# Patient Record
Sex: Male | Born: 1965 | Race: Black or African American | Hispanic: No | Marital: Single | Smoking: Former smoker
Health system: Southern US, Community
[De-identification: ages and names within clinical notes are randomized; demographics above are authoritative.]

## PROBLEM LIST (undated history)

## (undated) DIAGNOSIS — R946 Abnormal results of thyroid function studies: Secondary | ICD-10-CM

## (undated) DIAGNOSIS — R0789 Other chest pain: Secondary | ICD-10-CM

## (undated) DIAGNOSIS — E7800 Pure hypercholesterolemia, unspecified: Secondary | ICD-10-CM

## (undated) DIAGNOSIS — I10 Essential (primary) hypertension: Secondary | ICD-10-CM

## (undated) HISTORY — PX: KNEE SURGERY: SHX244

## (undated) HISTORY — PX: HERNIA REPAIR: SHX51

---

## 2003-12-27 ENCOUNTER — Other Ambulatory Visit: Payer: Self-pay

## 2004-09-08 ENCOUNTER — Emergency Department: Payer: Self-pay | Admitting: Emergency Medicine

## 2004-12-09 ENCOUNTER — Emergency Department: Payer: Self-pay | Admitting: Emergency Medicine

## 2005-01-13 ENCOUNTER — Other Ambulatory Visit: Payer: Self-pay

## 2005-01-13 ENCOUNTER — Emergency Department: Payer: Self-pay | Admitting: Emergency Medicine

## 2007-09-11 ENCOUNTER — Other Ambulatory Visit: Payer: Self-pay

## 2007-09-11 ENCOUNTER — Emergency Department: Payer: Self-pay | Admitting: Emergency Medicine

## 2007-11-03 ENCOUNTER — Emergency Department: Payer: Self-pay | Admitting: Emergency Medicine

## 2008-01-05 ENCOUNTER — Emergency Department: Payer: Self-pay | Admitting: Emergency Medicine

## 2008-01-05 ENCOUNTER — Other Ambulatory Visit: Payer: Self-pay

## 2008-09-19 ENCOUNTER — Emergency Department: Payer: Self-pay | Admitting: Emergency Medicine

## 2013-03-30 ENCOUNTER — Encounter (HOSPITAL_COMMUNITY): Payer: Self-pay | Admitting: Emergency Medicine

## 2013-03-30 ENCOUNTER — Emergency Department (HOSPITAL_COMMUNITY)
Admission: EM | Admit: 2013-03-30 | Discharge: 2013-03-31 | Attending: Emergency Medicine | Admitting: Emergency Medicine

## 2013-03-30 DIAGNOSIS — Z79899 Other long term (current) drug therapy: Secondary | ICD-10-CM | POA: Insufficient documentation

## 2013-03-30 DIAGNOSIS — R109 Unspecified abdominal pain: Secondary | ICD-10-CM | POA: Insufficient documentation

## 2013-03-30 DIAGNOSIS — Z7982 Long term (current) use of aspirin: Secondary | ICD-10-CM | POA: Insufficient documentation

## 2013-03-30 DIAGNOSIS — N5082 Scrotal pain: Secondary | ICD-10-CM

## 2013-03-30 DIAGNOSIS — N289 Disorder of kidney and ureter, unspecified: Secondary | ICD-10-CM

## 2013-03-30 DIAGNOSIS — Z9889 Other specified postprocedural states: Secondary | ICD-10-CM | POA: Insufficient documentation

## 2013-03-30 DIAGNOSIS — E7800 Pure hypercholesterolemia, unspecified: Secondary | ICD-10-CM | POA: Insufficient documentation

## 2013-03-30 DIAGNOSIS — F17200 Nicotine dependence, unspecified, uncomplicated: Secondary | ICD-10-CM | POA: Insufficient documentation

## 2013-03-30 DIAGNOSIS — I10 Essential (primary) hypertension: Secondary | ICD-10-CM | POA: Insufficient documentation

## 2013-03-30 HISTORY — DX: Pure hypercholesterolemia, unspecified: E78.00

## 2013-03-30 HISTORY — DX: Essential (primary) hypertension: I10

## 2013-03-30 LAB — CBC WITH DIFFERENTIAL/PLATELET
Eosinophils Relative: 1 % (ref 0–5)
HCT: 41.8 % (ref 39.0–52.0)
Hemoglobin: 13.8 g/dL (ref 13.0–17.0)
Lymphocytes Relative: 36 % (ref 12–46)
Lymphs Abs: 2.5 10*3/uL (ref 0.7–4.0)
MCV: 82.6 fL (ref 78.0–100.0)
Monocytes Absolute: 0.6 10*3/uL (ref 0.1–1.0)
RBC: 5.06 MIL/uL (ref 4.22–5.81)
WBC: 6.9 10*3/uL (ref 4.0–10.5)

## 2013-03-30 LAB — COMPREHENSIVE METABOLIC PANEL
ALT: 9 U/L (ref 0–53)
CO2: 29 mEq/L (ref 19–32)
Calcium: 9.5 mg/dL (ref 8.4–10.5)
Chloride: 101 mEq/L (ref 96–112)
Creatinine, Ser: 1.55 mg/dL — ABNORMAL HIGH (ref 0.50–1.35)
GFR calc Af Amer: 59 mL/min — ABNORMAL LOW (ref >90)
GFR calc non Af Amer: 51 mL/min — ABNORMAL LOW (ref >90)
Glucose, Bld: 104 mg/dL — ABNORMAL HIGH (ref 70–99)
Total Bilirubin: 0.5 mg/dL (ref 0.3–1.2)

## 2013-03-30 MED ORDER — KETOROLAC TROMETHAMINE 30 MG/ML IJ SOLN
30.0000 mg | Freq: Once | INTRAMUSCULAR | Status: AC
  Administered 2013-03-30: 30 mg via INTRAVENOUS
  Filled 2013-03-30: qty 1

## 2013-03-30 NOTE — ED Notes (Signed)
Patient complaining of right testicular pain. Reports started in right lower quadrant of abdomen and radiated into right testicle. Per nursing staff [facility] prison, patient's right testicle is swollen and somewhat retracted when compared to left testicle. Patient denies abdominal pain at this time, reports pain to right testicle only.

## 2013-03-30 NOTE — ED Provider Notes (Signed)
CSN: [id number]     Arrival date & time 03/30/13  2240 History  This chart was scribed for Dione Booze, MD by Greggory Stallion, ED Scribe. This patient was seen in room [facility]10/[facility]10 and the patient's care was started at 10:56 PM.   Chief Complaint  Patient presents with  . Testicle Pain   The history is provided by the patient. No language interpreter was used.    HPI Comments: Todd Robles is a 47 y.o. male who presents to the Emergency Department complaining of gradual onset, constant sharp right testicle pain that started around 8 PM tonight. He states the pain started in his RLQ and radiated to his testicle. He states the pain is worsened by certain movements. The nursing staff at the prison states his right testicle is swollen and somewhat retracted when compared to the left testicle. He has not taken anything for the pain. Pt denies abdominal pain, nausea, sweats and difficulty urinating as associated symptoms.   Past Medical History  Diagnosis Date  . Hypertension   . Hypercholesteremia    Past Surgical History  Procedure Laterality Date  . Hernia repair    . Knee surgery      left   History reviewed. No pertinent family history. History  Substance Use Topics  . Smoking status: Current Every Day Smoker  . Smokeless tobacco: Not on file  . Alcohol Use: Not on file    Review of Systems  Gastrointestinal: Negative for nausea and abdominal pain.  Genitourinary: Positive for testicular pain. Negative for difficulty urinating.  All other systems reviewed and are negative.    Allergies  Review of patient's allergies indicates no known allergies.  Home Medications   Current Outpatient Rx  Name  Route  Sig  Dispense  Refill  . aspirin 81 MG tablet   Oral   Take 81 mg by mouth daily.         . hydrochlorothiazide (HYDRODIURIL) 25 MG tablet   Oral   Take 25 mg by mouth daily.         . metoprolol (LOPRESSOR) 100 MG tablet   Oral   Take 100 mg by mouth 2  (two) times daily.         . simvastatin (ZOCOR) 40 MG tablet   Oral   Take 40 mg by mouth every evening.         . tamsulosin (FLOMAX) 0.4 MG CAPS capsule   Oral   Take 0.4 mg by mouth.         . tamsulosin (FLOMAX) 0.4 MG CAPS capsule   Oral   Take by mouth.          BP 167/103  Pulse 63  Temp(Src) 97.9 F (36.6 C) (Oral)  Resp 20  Ht 5\' 7"  (1.702 m)  Wt 205 lb (92.987 kg)  BMI 32.1 kg/m2  SpO2 100%  Physical Exam  Nursing note and vitals reviewed. Constitutional: He is oriented to person, place, and time. He appears well-developed and well-nourished. No distress.  HENT:  Head: Normocephalic and atraumatic.  Right Ear: External ear normal.  Left Ear: External ear normal.  Nose: Nose normal.  Mouth/Throat: Oropharynx is clear and moist.  Eyes: EOM are normal.  Neck: Normal range of motion. Neck supple. No tracheal deviation present.  Cardiovascular: Normal rate, regular rhythm and normal heart sounds.   No murmur heard. Pulmonary/Chest: Effort normal and breath sounds normal. No respiratory distress. He has no wheezes. He has no rales.  Abdominal: Soft. There is tenderness.  Mild tenderness RUQ and mid abdomen. Bowel sounds are decreased.   Genitourinary:  Circumsized penis. Right testicle somewhat high riding and mildly tender. No swelling or induration. Moderate tenderness of inguinal canal. No hernia palpable. No ungual adenopathy.   Musculoskeletal: Normal range of motion.  Mild right CVA tenderness.   Neurological: He is alert and oriented to person, place, and time.  Skin: Skin is warm and dry. He is not diaphoretic.  Psychiatric: He has a normal mood and affect. His behavior is normal.    ED Course   Procedures (including critical care time)  DIAGNOSTIC STUDIES: Oxygen Saturation is 100% on RA, normal by my interpretation.    COORDINATION OF CARE: 11:05 PM-Discussed treatment plan which includes pain medication, ultrasound and UA with pt at  bedside and pt agreed to plan.   Results for orders placed during the hospital encounter of 03/30/13  URINALYSIS, ROUTINE W REFLEX MICROSCOPIC      Result Value Range   Color, Urine YELLOW  YELLOW   APPearance CLEAR  CLEAR   Specific Gravity, Urine 1.010  1.005 - 1.030   pH 7.0  5.0 - 8.0   Glucose, UA NEGATIVE  NEGATIVE mg/dL   Hgb urine dipstick NEGATIVE  NEGATIVE   Bilirubin Urine NEGATIVE  NEGATIVE   Ketones, ur NEGATIVE  NEGATIVE mg/dL   Protein, ur NEGATIVE  NEGATIVE mg/dL   Urobilinogen, UA 0.2  0.0 - 1.0 mg/dL   Nitrite NEGATIVE  NEGATIVE   Leukocytes, UA NEGATIVE  NEGATIVE  CBC WITH DIFFERENTIAL      Result Value Range   WBC 6.9  4.0 - 10.5 K/uL   RBC 5.06  4.22 - 5.81 MIL/uL   Hemoglobin 13.8  13.0 - 17.0 g/dL   HCT 96.0  45.4 - 09.8 %   MCV 82.6  78.0 - 100.0 fL   MCH 27.3  26.0 - 34.0 pg   MCHC 33.0  30.0 - 36.0 g/dL   RDW 11.9  14.7 - 82.9 %   Platelets 199  150 - 400 K/uL   Neutrophils Relative % 54  43 - 77 %   Neutro Abs 3.7  1.7 - 7.7 K/uL   Lymphocytes Relative 36  12 - 46 %   Lymphs Abs 2.5  0.7 - 4.0 K/uL   Monocytes Relative 9  3 - 12 %   Monocytes Absolute 0.6  0.1 - 1.0 K/uL   Eosinophils Relative 1  0 - 5 %   Eosinophils Absolute 0.1  0.0 - 0.7 K/uL   Basophils Relative 0  0 - 1 %   Basophils Absolute 0.0  0.0 - 0.1 K/uL  COMPREHENSIVE METABOLIC PANEL      Result Value Range   Sodium 139  135 - 145 mEq/L   Potassium 3.6  3.5 - 5.1 mEq/L   Chloride 101  96 - 112 mEq/L   CO2 29  19 - 32 mEq/L   Glucose, Bld 104 (*) 70 - 99 mg/dL   BUN 14  6 - 23 mg/dL   Creatinine, Ser 5.62 (*) 0.50 - 1.35 mg/dL   Calcium 9.5  8.4 - 13.0 mg/dL   Total Protein 7.8  6.0 - 8.3 g/dL   Albumin 4.2  3.5 - 5.2 g/dL   AST 15  0 - 37 U/L   ALT 9  0 - 53 U/L   Alkaline Phosphatase 77  39 - 117 U/L   Total Bilirubin 0.5  0.3 -  1.2 mg/dL   GFR calc non Af Amer 51 (*) >90 mL/min   GFR calc Af Amer 59 (*) >90 mL/min  LIPASE, BLOOD      Result Value Range   Lipase 30   11 - 59 U/L   Ct Abdomen Pelvis Wo Contrast  03/31/2013   *RADIOLOGY REPORT*  Clinical Data: Right lower quadrant pain  CT ABDOMEN AND PELVIS WITHOUT CONTRAST  Technique:  Multidetector CT imaging of the abdomen and pelvis was performed following the standard protocol without intravenous contrast.  Comparison: None.  Findings: Minimal dependent bibasilar atelectasis.  Atrophic right kidney with peripheral calcification incidentally noted. Compensatory hypertrophy of the normal-appearing left kidney.  Liver, gallbladder, spleen, pancreas, and adrenal glands are normal.  The appendix is normal.  Bladder physiologically distended.  No radiopaque renal, ureteral, or bladder calculus.  No bowel wall thickening or focal segmental dilatation.  No free air.  No lymphadenopathy.  Mild bilateral hip degenerative change and lumbar spine degenerative change.  No acute osseous abnormality.  IMPRESSION: No acute intra-abdominal or pelvic pathology.   Original Report Authenticated By: Christiana Pellant, M.D.   US Scrotum  03/31/2013   *RADIOLOGY REPORT*  Clinical Data:  Right testicular pain  SCROTAL ULTRASOUND DOPPLER ULTRASOUND OF THE TESTICLES  Technique: Complete ultrasound examination of the testicles, epididymis, and other scrotal structures was performed.  Color and spectral Doppler ultrasound were also utilized to evaluate blood flow to the testicles.  Comparison:  None  Findings:  Right testis:  3.7 x 2.7 x 2.0 cm.  Normal.  Left testis:  3.8 x 2.7 x 2.3 cm.  Normal.  Right epididymis:  4 mm cyst incidentally noted.  Epididymis otherwise unremarkable.  Left epididymis:  Normal.  Hydrocele:  Small left hydrocele.  Varicocele:  No varicocele identified.  Pulsed Doppler interrogation of both testes demonstrates low resistance arterial and venous wave forms bilaterally.  IMPRESSION: Normal exam.  No sonographic evidence for testicular torsion or testicular abnormality otherwise. These results were called by telephone on  03/31/2013 at 12:40am. to Dr. Preston Fleeting, who verbally acknowledged these results.   Original Report Authenticated By: Christiana Pellant, M.D.   [place] Art/ven Flow Abd Pelv Doppler  03/31/2013   *RADIOLOGY REPORT*  Clinical Data:  Right testicular pain  SCROTAL ULTRASOUND DOPPLER ULTRASOUND OF THE TESTICLES  Technique: Complete ultrasound examination of the testicles, epididymis, and other scrotal structures was performed.  Color and spectral Doppler ultrasound were also utilized to evaluate blood flow to the testicles.  Comparison:  None  Findings:  Right testis:  3.7 x 2.7 x 2.0 cm.  Normal.  Left testis:  3.8 x 2.7 x 2.3 cm.  Normal.  Right epididymis:  4 mm cyst incidentally noted.  Epididymis otherwise unremarkable.  Left epididymis:  Normal.  Hydrocele:  Small left hydrocele.  Varicocele:  No varicocele identified.  Pulsed Doppler interrogation of both testes demonstrates low resistance arterial and venous wave forms bilaterally.  IMPRESSION: Normal exam.  No sonographic evidence for testicular torsion or testicular abnormality otherwise. These results were called by telephone on 03/31/2013 at 12:40am. to Dr. Preston Fleeting, who verbally acknowledged these results.   Original Report Authenticated By: Christiana Pellant, M.D.    Images viewed by me, discussed with radiologist.  1. Right flank pain   2. Scrotal pain   3. Renal insufficiency     MDM  Flank and scrotal pain worrisome for possible kidney stone. Abnormal position of the right testicle is worried some for possible torsion so he will  be sent for ultrasound. He'll be given a therapeutic with ketorolac for pain.  12:45 AM He got moderate relief of pain with ketorolac. Pain is down to 5/10. Ultrasound shows no evidence of torsion. He'll be sent for CT of the abdomen to rule out kidney stone and will be given a dose of morphine for pain. Incidentally noted is mildly elevated creatinine of 1.55.   CT shows markedly atrophic right kidney which explains his  renal insufficiency. No other findings noted on CT scan. He will be discharged with prescriptions for naproxen and tramadol.     I personally performed the services described in this documentation, which was scribed in my presence. The recorded information has been reviewed and is accurate.  Dione Booze, MD 03/31/13 [phone]

## 2013-03-30 NOTE — ED Notes (Signed)
Nursing staff at [facility] stated to call the  prison triage at [phone] when patient is to be discharged or admitted.

## 2013-03-31 ENCOUNTER — Emergency Department (HOSPITAL_COMMUNITY)

## 2013-03-31 LAB — URINALYSIS, ROUTINE W REFLEX MICROSCOPIC
Glucose, UA: NEGATIVE mg/dL
Hgb urine dipstick: NEGATIVE
Specific Gravity, Urine: 1.01 (ref 1.005–1.030)
pH: 7 (ref 5.0–8.0)

## 2013-03-31 MED ORDER — TRAMADOL HCL 50 MG PO TABS: 50.0000 mg | ORAL_TABLET | Freq: Four times a day (QID) | ORAL | Status: DC | PRN

## 2013-03-31 MED ORDER — NAPROXEN 500 MG PO TABS: 500.0000 mg | ORAL_TABLET | Freq: Two times a day (BID) | ORAL | Status: DC

## 2013-03-31 MED ORDER — MORPHINE SULFATE 4 MG/ML IJ SOLN
4.0000 mg | Freq: Once | INTRAMUSCULAR | Status: AC
  Administered 2013-03-31: 4 mg via INTRAVENOUS
  Filled 2013-03-31: qty 1

## 2014-02-09 ENCOUNTER — Emergency Department (HOSPITAL_COMMUNITY)
Admission: EM | Admit: 2014-02-09 | Discharge: 2014-02-09 | Disposition: A | Discharge status: Home / Self Care | Attending: Emergency Medicine | Admitting: Emergency Medicine

## 2014-02-09 ENCOUNTER — Encounter (HOSPITAL_COMMUNITY): Payer: Self-pay | Admitting: Emergency Medicine

## 2014-02-09 ENCOUNTER — Emergency Department (HOSPITAL_COMMUNITY)

## 2014-02-09 DIAGNOSIS — E7800 Pure hypercholesterolemia, unspecified: Secondary | ICD-10-CM | POA: Insufficient documentation

## 2014-02-09 DIAGNOSIS — Z79899 Other long term (current) drug therapy: Secondary | ICD-10-CM | POA: Insufficient documentation

## 2014-02-09 DIAGNOSIS — S6000XA Contusion of unspecified finger without damage to nail, initial encounter: Secondary | ICD-10-CM | POA: Insufficient documentation

## 2014-02-09 DIAGNOSIS — S6010XA Contusion of unspecified finger with damage to nail, initial encounter: Secondary | ICD-10-CM

## 2014-02-09 DIAGNOSIS — Y9389 Activity, other specified: Secondary | ICD-10-CM | POA: Insufficient documentation

## 2014-02-09 DIAGNOSIS — F17200 Nicotine dependence, unspecified, uncomplicated: Secondary | ICD-10-CM | POA: Insufficient documentation

## 2014-02-09 DIAGNOSIS — Z23 Encounter for immunization: Secondary | ICD-10-CM | POA: Insufficient documentation

## 2014-02-09 DIAGNOSIS — W230XXA Caught, crushed, jammed, or pinched between moving objects, initial encounter: Secondary | ICD-10-CM | POA: Insufficient documentation

## 2014-02-09 DIAGNOSIS — I10 Essential (primary) hypertension: Secondary | ICD-10-CM | POA: Insufficient documentation

## 2014-02-09 DIAGNOSIS — Z7982 Long term (current) use of aspirin: Secondary | ICD-10-CM | POA: Insufficient documentation

## 2014-02-09 DIAGNOSIS — Y929 Unspecified place or not applicable: Secondary | ICD-10-CM | POA: Insufficient documentation

## 2014-02-09 MED ORDER — BUPIVACAINE HCL (PF) 0.5 % IJ SOLN
10.0000 mL | Freq: Once | INTRAMUSCULAR | Status: AC
  Administered 2014-02-09: 10 mL
  Filled 2014-02-09: qty 30

## 2014-02-09 MED ORDER — IBUPROFEN 600 MG PO TABS: 600.0000 mg | ORAL_TABLET | Freq: Four times a day (QID) | ORAL | Status: DC | PRN

## 2014-02-09 MED ORDER — HYDROCODONE-ACETAMINOPHEN 5-325 MG PO TABS: 1.0000 | ORAL_TABLET | ORAL | Status: DC | PRN

## 2014-02-09 MED ORDER — TETANUS-DIPHTH-ACELL PERTUSSIS 5-2.5-18.5 LF-MCG/0.5 IM SUSP
0.5000 mL | Freq: Once | INTRAMUSCULAR | Status: AC
  Administered 2014-02-09: 0.5 mL via INTRAMUSCULAR
  Filled 2014-02-09: qty 0.5

## 2014-02-09 NOTE — Discharge Instructions (Signed)
Use medicines prescribed for pain and swelling.  Ice and elevation will also help with pain.  You should have your dressing changed twice daily with a warm water soapy wash then no stick dressing reapplied until the abrasion has formed a scab and the wound stops bleeding.

## 2014-02-09 NOTE — ED Notes (Signed)
Injury to rt 5th finger , digital block done by PA.  Pt is a prisoner , cuffed to stretcher.

## 2014-02-09 NOTE — ED Notes (Signed)
Slammed right pinky finger in door, distal end of finger bleeding slighty and swollen

## 2014-02-11 NOTE — ED Provider Notes (Signed)
CSN: [id number]     Arrival date & time 02/09/14  1034 History   First MD Initiated Contact with Patient 02/09/14 1050     Chief Complaint  Patient presents with  . Finger Injury     (Consider location/radiation/quality/duration/timing/severity/associated sxs/prior Treatment) The history is provided by the patient.   Todd Robles is a 48 y.o. male presenting with injury to his right 5th finger after sustaining a crush injury when slamming in a door.  He reports constant pain, swelling and bleeding from around the nail.  He has distal sensation in the finger but has pain with flexion limiting this movement.  He has applied a dressing prior to arrival and the bleeding is controlled.  He denies other injury or complaint.  He does not know his tetanus status. He is an inmate at a local prison, there is no documentation of tetanus status in his presenting chart.     Past Medical History  Diagnosis Date  . Hypertension   . Hypercholesteremia    Past Surgical History  Procedure Laterality Date  . Hernia repair    . Knee surgery      left   History reviewed. No pertinent family history. History  Substance Use Topics  . Smoking status: Current Every Day Smoker  . Smokeless tobacco: Not on file  . Alcohol Use: Not on file    Review of Systems  Constitutional: Negative for fever.  Musculoskeletal: Positive for arthralgias and joint swelling. Negative for myalgias.  Neurological: Negative for weakness and numbness.      Allergies  Review of patient's allergies indicates no known allergies.  Home Medications   Prior to Admission medications   Medication Sig Start Date End Date Taking? Authorizing Provider  aspirin 81 MG tablet Take 81 mg by mouth daily.   Yes Historical Provider, MD  hydrochlorothiazide (HYDRODIURIL) 25 MG tablet Take 25 mg by mouth daily.   Yes Historical Provider, MD  metoprolol (LOPRESSOR) 100 MG tablet Take 100 mg by mouth 2 (two) times daily.   Yes  Historical Provider, MD  simvastatin (ZOCOR) 40 MG tablet Take 40 mg by mouth every evening.   Yes Historical Provider, MD  tamsulosin (FLOMAX) 0.4 MG CAPS capsule Take 0.4 mg by mouth daily.    Yes Historical Provider, MD  HYDROcodone-acetaminophen (NORCO/VICODIN) 5-325 MG per tablet Take 1 tablet by mouth every 4 (four) hours as needed. 02/09/14   Burgess AmorJulie Idol, PA-C  ibuprofen (ADVIL,MOTRIN) 600 MG tablet Take 1 tablet (600 mg total) by mouth every 6 (six) hours as needed. 02/09/14   Burgess AmorJulie Idol, PA-C   BP 172/98  Pulse 62  Temp(Src) 97.3 F (36.3 C) (Oral)  Resp 18  Ht 5\' 5"  (1.651 m)  Wt 194 lb (87.998 kg)  BMI 32.28 kg/m2  SpO2 97% Physical Exam  Nursing note and vitals reviewed. Constitutional: He appears well-developed and well-nourished.  HENT:  Head: Atraumatic.  Neck: Normal range of motion.  Cardiovascular:  Pulses equal bilaterally  Musculoskeletal: He exhibits tenderness.  Edema and tenderness right 5th distal phalanx.  His nail plate is intact with a proximal subungual hematoma.  There is a small puncture at the base of the nail which is spontaneously draining the hematoma.  There is a deep abrasion of the proximal nail fold.  No lacerations noted.  Distal sensation intact.  Neurological: He is alert. He has normal strength. He displays normal reflexes. No sensory deficit.  Skin: Skin is warm and dry.  Psychiatric: He has  a normal mood and affect.    ED Course  NERVE BLOCK Date/Time: 02/09/2014 11:00 AM Performed by: Burgess AmorIDOL, JULIE Authorized by: Burgess AmorIDOL, JULIE Consent: Verbal consent obtained. Risks and benefits: risks, benefits and alternatives were discussed Consent given by: patient Body area: upper extremity Nerve: digital Laterality: right Patient sedated: no Preparation: Patient was prepped and draped in the usual sterile fashion. Needle gauge: 25 G Location technique: anatomical landmarks Local anesthetic: bupivacaine 0.5% without epinephrine Anesthetic total:  1.5 ml Outcome: pain improved Patient tolerance: Patient tolerated the procedure well with no immediate complications. Comments: Finger was cleaned, xeroform applied, bulky dressing.    (including critical care time) Labs Review Labs Reviewed - No data to display  Imaging Review No results found.   EKG Interpretation None       Dg Finger Little Right  02/09/2014   CLINICAL DATA:  FINGER INJURY  EXAM: RIGHT LITTLE FINGER 2+V  COMPARISON:  None.  FINDINGS: There is no evidence of fracture or dislocation. There is no evidence of arthropathy or other focal bone abnormality. Soft tissues are unremarkable.  IMPRESSION: Negative.   Electronically Signed   By: Salome HolmesHector  Cooper M.D.   On: 02/09/2014 11:23      MDM   Final diagnoses:  Subungual hematoma of digit of hand, initial encounter    Patients labs and/or radiological studies were viewed and considered during the medical decision making and disposition process.   No fractures. Encouraged warm soapy water wash bid, keep covered until abrasion scabbed.  Prn f/u anticipated.    Burgess AmorJulie Idol, PA-C 02/11/14 1142

## 2014-02-18 NOTE — ED Provider Notes (Signed)
Medical screening examination/treatment/procedure(s) were performed by non-physician practitioner and as supervising physician I was immediately available for consultation/collaboration.   EKG Interpretation None        Joseph L Zammit, MD 02/18/14 1110 

## 2014-12-18 ENCOUNTER — Emergency Department (HOSPITAL_COMMUNITY)

## 2014-12-18 ENCOUNTER — Other Ambulatory Visit (HOSPITAL_COMMUNITY): Payer: Self-pay

## 2014-12-18 ENCOUNTER — Observation Stay (HOSPITAL_COMMUNITY)
Admission: EM | Admit: 2014-12-18 | Discharge: 2014-12-19 | Disposition: A | Discharge status: Home / Self Care | Attending: Internal Medicine | Admitting: Internal Medicine

## 2014-12-18 ENCOUNTER — Encounter (HOSPITAL_COMMUNITY): Payer: Self-pay | Admitting: *Deleted

## 2014-12-18 DIAGNOSIS — Z7982 Long term (current) use of aspirin: Secondary | ICD-10-CM | POA: Diagnosis not present

## 2014-12-18 DIAGNOSIS — N179 Acute kidney failure, unspecified: Secondary | ICD-10-CM | POA: Diagnosis present

## 2014-12-18 DIAGNOSIS — R079 Chest pain, unspecified: Principal | ICD-10-CM | POA: Insufficient documentation

## 2014-12-18 DIAGNOSIS — R0789 Other chest pain: Secondary | ICD-10-CM | POA: Diagnosis not present

## 2014-12-18 DIAGNOSIS — Z79899 Other long term (current) drug therapy: Secondary | ICD-10-CM | POA: Insufficient documentation

## 2014-12-18 DIAGNOSIS — E785 Hyperlipidemia, unspecified: Secondary | ICD-10-CM | POA: Diagnosis present

## 2014-12-18 DIAGNOSIS — E782 Mixed hyperlipidemia: Secondary | ICD-10-CM | POA: Insufficient documentation

## 2014-12-18 DIAGNOSIS — I10 Essential (primary) hypertension: Secondary | ICD-10-CM | POA: Diagnosis not present

## 2014-12-18 DIAGNOSIS — Z720 Tobacco use: Secondary | ICD-10-CM | POA: Diagnosis not present

## 2014-12-18 DIAGNOSIS — K219 Gastro-esophageal reflux disease without esophagitis: Secondary | ICD-10-CM | POA: Diagnosis present

## 2014-12-18 DIAGNOSIS — R946 Abnormal results of thyroid function studies: Secondary | ICD-10-CM | POA: Diagnosis present

## 2014-12-18 HISTORY — DX: Abnormal results of thyroid function studies: R94.6

## 2014-12-18 HISTORY — DX: Other chest pain: R07.89

## 2014-12-18 LAB — CBC
HCT: 45.4 % (ref 39.0–52.0)
HEMATOCRIT: 44.5 % (ref 39.0–52.0)
HEMOGLOBIN: 14.5 g/dL (ref 13.0–17.0)
Hemoglobin: 14.3 g/dL (ref 13.0–17.0)
MCH: 26.8 pg (ref 26.0–34.0)
MCH: 27 pg (ref 26.0–34.0)
MCHC: 31.9 g/dL (ref 30.0–36.0)
MCHC: 32.1 g/dL (ref 30.0–36.0)
MCV: 83.8 fL (ref 78.0–100.0)
MCV: 84 fL (ref 78.0–100.0)
PLATELETS: 193 10*3/uL (ref 150–400)
Platelets: 199 10*3/uL (ref 150–400)
RBC: 5.42 MIL/uL (ref 4.22–5.81)
RBC: 5.3 MIL/uL (ref 4.22–5.81)
RDW: 13.9 % (ref 11.5–15.5)
RDW: 14 % (ref 11.5–15.5)
WBC: 5.7 10*3/uL (ref 4.0–10.5)
WBC: 5.9 10*3/uL (ref 4.0–10.5)

## 2014-12-18 LAB — BASIC METABOLIC PANEL
ANION GAP: 8 (ref 5–15)
BUN: 16 mg/dL (ref 6–20)
CALCIUM: 8.9 mg/dL (ref 8.9–10.3)
CHLORIDE: 106 mmol/L (ref 101–111)
CO2: 27 mmol/L (ref 22–32)
Creatinine, Ser: 1.33 mg/dL — ABNORMAL HIGH (ref 0.61–1.24)
GFR calc Af Amer: >60 mL/min (ref >60)
GLUCOSE: 143 mg/dL — AB (ref 70–99)
POTASSIUM: 3.7 mmol/L (ref 3.5–5.1)
SODIUM: 141 mmol/L (ref 135–145)

## 2014-12-18 LAB — DIFFERENTIAL
BASOS ABS: 0 10*3/uL (ref 0.0–0.1)
BASOS PCT: 0 % (ref 0–1)
Eosinophils Absolute: 0.1 10*3/uL (ref 0.0–0.7)
Eosinophils Relative: 2 % (ref 0–5)
LYMPHS PCT: 37 % (ref 12–46)
Lymphs Abs: 2.1 10*3/uL (ref 0.7–4.0)
Monocytes Absolute: 0.7 10*3/uL (ref 0.1–1.0)
Monocytes Relative: 12 % (ref 3–12)
NEUTROS PCT: 49 % (ref 43–77)
Neutro Abs: 2.7 10*3/uL (ref 1.7–7.7)

## 2014-12-18 LAB — TSH: TSH: 6.139 u[IU]/mL — ABNORMAL HIGH (ref 0.350–4.500)

## 2014-12-18 LAB — TROPONIN I
Troponin I: <0.03 ng/mL (ref <0.031)
Troponin I: <0.03 ng/mL (ref <0.031)
Troponin I: <0.03 ng/mL (ref <0.031)
Troponin I: <0.03 ng/mL (ref <0.031)

## 2014-12-18 LAB — HEMOGLOBIN AND HEMATOCRIT, BLOOD
HEMATOCRIT: 42 % (ref 39.0–52.0)
HEMOGLOBIN: 13.8 g/dL (ref 13.0–17.0)

## 2014-12-18 LAB — CREATININE, SERUM
Creatinine, Ser: 1.28 mg/dL — ABNORMAL HIGH (ref 0.61–1.24)
GFR calc Af Amer: >60 mL/min (ref >60)

## 2014-12-18 LAB — MRSA PCR SCREENING: MRSA BY PCR: NEGATIVE

## 2014-12-18 LAB — D-DIMER, QUANTITATIVE: D-Dimer, Quant: <0.27 ug/mL-FEU (ref 0.00–0.48)

## 2014-12-18 MED ORDER — ALUM & MAG HYDROXIDE-SIMETH 200-200-20 MG/5ML PO SUSP: 30.0000 mL | Freq: Four times a day (QID) | ORAL | Status: DC | PRN

## 2014-12-18 MED ORDER — ONDANSETRON HCL 4 MG/2ML IJ SOLN: 4.0000 mg | Freq: Four times a day (QID) | INTRAMUSCULAR | Status: DC | PRN

## 2014-12-18 MED ORDER — NITROGLYCERIN 0.4 MG SL SUBL
0.4000 mg | SUBLINGUAL_TABLET | SUBLINGUAL | Status: AC | PRN
  Administered 2014-12-18 (×3): 0.4 mg via SUBLINGUAL
  Filled 2014-12-18: qty 1

## 2014-12-18 MED ORDER — METOPROLOL TARTRATE 50 MG PO TABS
100.0000 mg | ORAL_TABLET | Freq: Two times a day (BID) | ORAL | Status: DC
  Filled 2014-12-18: qty 2

## 2014-12-18 MED ORDER — METOPROLOL SUCCINATE ER 50 MG PO TB24
100.0000 mg | ORAL_TABLET | Freq: Two times a day (BID) | ORAL | Status: DC
Start: 2014-12-18 — End: 2014-12-19
  Administered 2014-12-18 – 2014-12-19 (×2): 100 mg via ORAL
  Filled 2014-12-18 (×2): qty 2

## 2014-12-18 MED ORDER — SODIUM CHLORIDE 0.9 % IJ SOLN
3.0000 mL | Freq: Two times a day (BID) | INTRAMUSCULAR | Status: DC
  Administered 2014-12-18 – 2014-12-19 (×2): 3 mL via INTRAVENOUS

## 2014-12-18 MED ORDER — PANTOPRAZOLE SODIUM 40 MG IV SOLR
40.0000 mg | Freq: Two times a day (BID) | INTRAVENOUS | Status: DC
  Administered 2014-12-18 – 2014-12-19 (×3): 40 mg via INTRAVENOUS
  Filled 2014-12-18 (×3): qty 40

## 2014-12-18 MED ORDER — ASPIRIN EC 81 MG PO TBEC
81.0000 mg | DELAYED_RELEASE_TABLET | Freq: Every day | ORAL | Status: DC
  Administered 2014-12-18 – 2014-12-19 (×2): 81 mg via ORAL
  Filled 2014-12-18 (×2): qty 1

## 2014-12-18 MED ORDER — ENOXAPARIN SODIUM 40 MG/0.4ML ~~LOC~~ SOLN
40.0000 mg | SUBCUTANEOUS | Status: DC
  Administered 2014-12-18 – 2014-12-19 (×2): 40 mg via SUBCUTANEOUS
  Filled 2014-12-18 (×2): qty 0.4

## 2014-12-18 MED ORDER — MORPHINE SULFATE 4 MG/ML IJ SOLN
4.0000 mg | Freq: Once | INTRAMUSCULAR | Status: AC
  Administered 2014-12-18: 4 mg via INTRAVENOUS
  Filled 2014-12-18: qty 1

## 2014-12-18 MED ORDER — MORPHINE SULFATE 2 MG/ML IJ SOLN: 2.0000 mg | INTRAMUSCULAR | Status: DC | PRN

## 2014-12-18 MED ORDER — HYDRALAZINE HCL 20 MG/ML IJ SOLN: 5.0000 mg | INTRAMUSCULAR | Status: DC | PRN

## 2014-12-18 MED ORDER — SIMVASTATIN 20 MG PO TABS
40.0000 mg | ORAL_TABLET | Freq: Every evening | ORAL | Status: DC
  Administered 2014-12-18: 40 mg via ORAL
  Filled 2014-12-18: qty 2

## 2014-12-18 MED ORDER — ONDANSETRON HCL 4 MG PO TABS: 4.0000 mg | ORAL_TABLET | Freq: Four times a day (QID) | ORAL | Status: DC | PRN

## 2014-12-18 NOTE — ED Notes (Signed)
Patient resting in bed at this time. Officers at bedside. Water given to patient at patient request. No other needs voiced at this time.

## 2014-12-18 NOTE — Progress Notes (Signed)
Patient is a 49 year old man who was admitted to the hospital this morning by Dr. Conley RollsLe for chief complaint of chest pain. The patient is incarcerated. He was briefly seen and examined. His chart, vital signs, laboratory studies were reviewed. Agree with current management with exception/additions below.  -Patient is bradycardic, likely secondary to metoprolol. Would favor changing short acting metoprolol to long-acting Toprol-XL in place parameters to hold it for heart rate of 50 or less. -We'll check a TSH. -We'll check a fasting lipid profile in the morning. -2-D echocardiogram ordered and is pending.

## 2014-12-18 NOTE — ED Notes (Signed)
Pt c/o pain and numbness to left arm

## 2014-12-18 NOTE — H&P (Signed)
[facility] History and Physical  ADRICK KESTLER ZOX:[id number] DOB: 01/10/1966    PCP:   [facility].  Chief Complaint: chest pain.   HPI: Todd Robles is an 49 y.o. male with hx of HLD on statin, HTN on betablocker and diuretic, hx of GERD, on daily ASA, and family hx of premature CAD, with no known hx of CAD, brought [facility] with complaint of ongoing left sided chest pain.  He had no SOB, nausea, vomiting, but admitted to some diaphoresis.  He has been active, and had been able to "play ball" for an hour without exertional CP.  He denied tobacco, drug or alcohol use.  Evlaution in the ER showed normal EKG, negative troponin, and clear CXR.  He said his pain had improved in the ER, but still had residual CP.  He admitted to GERD, and he takes regular antacids.  Hospitalist was asked to admit him for atypical chest pain r/out.   Rewiew of Systems:  Constitutional: Negative for malaise, fever and chills. No significant weight loss or weight gain Eyes: Negative for eye pain, redness and discharge, diplopia, visual changes, or flashes of light. ENMT: Negative for ear pain, hoarseness, nasal congestion, sinus pressure and sore throat. No headaches; tinnitus, drooling, or problem swallowing. Cardiovascular: Negative for palpitations, diaphoresis, dyspnea and peripheral edema. ; No orthopnea, PND Respiratory: Negative for cough, hemoptysis, wheezing and stridor. No pleuritic chestpain. Gastrointestinal: Negative for nausea, vomiting, diarrhea, constipation, abdominal pain, melena, blood in stool, hematemesis, jaundice and rectal bleeding.    Genitourinary: Negative for frequency, dysuria, incontinence,flank pain and hematuria; Musculoskeletal: Negative for back pain and neck pain. Negative for swelling and trauma.;  Skin: . Negative for pruritus, rash, abrasions, bruising and skin lesion.; ulcerations Neuro: Negative for headache, lightheadedness  and neck stiffness. Negative for weakness, altered level of consciousness , altered mental status, extremity weakness, burning feet, involuntary movement, seizure and syncope.  Psych: negative for anxiety, depression, insomnia, tearfulness, panic attacks, hallucinations, paranoia, suicidal or homicidal ideation    Past Medical History  Diagnosis Date  . Hypertension   . Hypercholesteremia     Past Surgical History  Procedure Laterality Date  . Hernia repair    . Knee surgery      left    Medications:  HOME MEDS: Prior to Admission medications   Medication Sig Start Date End Date Taking? Authorizing Provider  aspirin 81 MG tablet Take 81 mg by mouth daily.    Historical Provider, MD  hydrochlorothiazide (HYDRODIURIL) 25 MG tablet Take 25 mg by mouth daily.    Historical Provider, MD  HYDROcodone-acetaminophen (NORCO/VICODIN) 5-325 MG per tablet Take 1 tablet by mouth every 4 (four) hours as needed. 02/09/14   Burgess Amor, PA-C  ibuprofen (ADVIL,MOTRIN) 600 MG tablet Take 1 tablet (600 mg total) by mouth every 6 (six) hours as needed. 02/09/14   Burgess Amor, PA-C  metoprolol (LOPRESSOR) 100 MG tablet Take 100 mg by mouth 2 (two) times daily.    Historical Provider, MD  simvastatin (ZOCOR) 40 MG tablet Take 40 mg by mouth every evening.    Historical Provider, MD  tamsulosin (FLOMAX) 0.4 MG CAPS capsule Take 0.4 mg by mouth daily.     Historical Provider, MD     Allergies:  No Known Allergies  Social History:   reports that he has been smoking.  He does not have any smokeless tobacco history on file. He reports that he uses illicit drugs (Cocaine). He  reports that he does not drink alcohol.  Family History: History reviewed. No pertinent family history.   Physical Exam: Filed Vitals:   12/18/14 0400 12/18/14 0430 12/18/14 0530 12/18/14 0600  BP: 126/58 126/87 129/77 160/80  Pulse: 60 58 61 56  Temp:      TempSrc:      Resp: 18 16 12 13   Height:      Weight:      SpO2: 98%  98% 98% 98%   Blood pressure 160/80, pulse 56, temperature 98.3 F (36.8 C), temperature source Oral, resp. rate 13, height 5\' 6"  (1.676 m), weight 88.451 kg (195 lb), SpO2 98 %.  GEN:  Pleasant  patient lying in the stretcher in no acute distress; cooperative with exam. PSYCH:  alert and oriented x4; does not appear anxious or depressed; affect is appropriate. HEENT: Mucous membranes pink and anicteric; PERRLA; EOM intact; no cervical lymphadenopathy nor thyromegaly or carotid bruit; no JVD; There were no stridor. Neck is very supple. Breasts:: Not examined CHEST WALL: No tenderness CHEST: Normal respiration, clear to auscultation bilaterally.  HEART: Regular rate and rhythm.  There are no murmur, rub, or gallops.   BACK: No kyphosis or scoliosis; no CVA tenderness ABDOMEN: soft and non-tender; no masses, no organomegaly, normal abdominal bowel sounds; no pannus; no intertriginous candida. There is no rebound and no distention. Rectal Exam: Not done EXTREMITIES: No bone or joint deformity; age-appropriate arthropathy of the hands and knees; no edema; no ulcerations.  There is no calf tenderness. Genitalia: not examined PULSES: 2+ and symmetric SKIN: Normal hydration no rash or ulceration CNS: Cranial nerves 2-12 grossly intact no focal lateralizing neurologic deficit.  Speech is fluent; uvula elevated with phonation, facial symmetry and tongue midline. DTR are normal bilaterally, cerebella exam is intact, barbinski is negative and strengths are equaled bilaterally.  No sensory loss.   Labs on Admission:  Basic Metabolic Panel:  Recent Labs Lab 12/18/14 0234  NA 141  K 3.7  CL 106  CO2 27  GLUCOSE 143*  BUN 16  CREATININE 1.33*  CALCIUM 8.9   CBC:  Recent Labs Lab 12/18/14 0234  WBC 5.7  NEUTROABS 2.7  HGB 14.5  HCT 45.4  MCV 83.8  PLT 193   Cardiac Enzymes:  Recent Labs Lab 12/18/14 0234  TROPONINI <0.03    CBG: No results for input(s): GLUCAP in the last 168  hours.   Radiological Exams on Admission: Dg Chest Portable 1 View  12/18/2014   CLINICAL DATA:  Chest pain and dizziness, pain radiating to LEFT arm for 1.5 hours. History of hypertension.  EXAM: PORTABLE CHEST - 1 VIEW  COMPARISON:  None.  FINDINGS: Cardiomediastinal silhouette is unremarkable for this low inspiratory portable examination with crowded vasculature markings. The lungs are clear without pleural effusions or focal consolidations. Trachea projects midline and there is no pneumothorax. Included soft tissue planes and osseous structures are non-suspicious.  IMPRESSION: Normal portable chest radiograph.   Electronically Signed   By: Awilda Metroourtnay  Bloomer   On: 12/18/2014 02:54    EKG: Independently reviewed.    Assessment/Plan Present on Admission:  . Atypical chest pain . GERD (gastroesophageal reflux disease) . HTN (hypertension) . Hyperlipidemia  PLAN:  Will admit him for atypical chest pain r/out.  He does have significant CAD risks, but doesn't present clinically with ACS.  Will cycle his troponins, continue ASA, betablocker and statin.  WIll obtain an ECHO.  He will be given IV PPI in case his atypical chest pain  is caused by GI reflux and esophageal spasm.  He is stable, full code, and will be admitted to [facility] service.   Other plans as per orders.  Code Status: FULL Unk Lightning, MD. [facility] Pager [phone] 7pm to 7am.  12/18/2014, 6:28 AM

## 2014-12-18 NOTE — ED Provider Notes (Signed)
CSN: [id number]     Arrival date & time 12/18/14  0218 History   First MD Initiated Contact with Patient 12/18/14 0239     Chief Complaint  Patient presents with  . Chest Pain     (Consider location/radiation/quality/duration/timing/severity/associated sxs/prior Treatment) Patient is a 49 y.o. male presenting with chest pain. The history is provided by the patient.  Chest Pain He woke up at 1 AM to have a bowel movement. On completing the bowel movement, he noted that he was lightheaded and developed a sharp midsternal chest pain without radiation. Pain was severe and he rated it at 8/10. It was worse with movement and walking. Nothing made it better. There is no associated dyspnea, nausea, diaphoresis. He did have some numbness to his left arm. EMS arrived and gave him aspirin and nitroglycerin with some improvement of the pain and did 20 down to 6/10. He only received one nitroglycerin tablet. He does have significant cardiac risk factors of hypertension and hyperlipidemia as well as family history of premature coronary atherosclerosis (his mother had cardiac disease onset about age 81).  Past Medical History  Diagnosis Date  . Hypertension   . Hypercholesteremia    Past Surgical History  Procedure Laterality Date  . Hernia repair    . Knee surgery      left   History reviewed. No pertinent family history. History  Substance Use Topics  . Smoking status: Current Every Day Smoker  . Smokeless tobacco: Not on file  . Alcohol Use: No    Review of Systems  Cardiovascular: Positive for chest pain.  All other systems reviewed and are negative.     Allergies  Review of patient's allergies indicates no known allergies.  Home Medications   Prior to Admission medications   Medication Sig Start Date End Date Taking? Authorizing Provider  aspirin 81 MG tablet Take 81 mg by mouth daily.    Historical Provider, MD  hydrochlorothiazide (HYDRODIURIL) 25 MG tablet Take 25 mg by mouth  daily.    Historical Provider, MD  HYDROcodone-acetaminophen (NORCO/VICODIN) 5-325 MG per tablet Take 1 tablet by mouth every 4 (four) hours as needed. 02/09/14   Burgess Amor, PA-C  ibuprofen (ADVIL,MOTRIN) 600 MG tablet Take 1 tablet (600 mg total) by mouth every 6 (six) hours as needed. 02/09/14   Burgess Amor, PA-C  metoprolol (LOPRESSOR) 100 MG tablet Take 100 mg by mouth 2 (two) times daily.    Historical Provider, MD  simvastatin (ZOCOR) 40 MG tablet Take 40 mg by mouth every evening.    Historical Provider, MD  tamsulosin (FLOMAX) 0.4 MG CAPS capsule Take 0.4 mg by mouth daily.     Historical Provider, MD   BP 165/91 mmHg  Pulse 68  Temp(Src) 98.3 F (36.8 C) (Oral)  Resp 16  Ht  (1.676 m)  Wt 195 lb (88.451 kg)  BMI 31.49 kg/m2  SpO2 99% Physical Exam  Nursing note and vitals reviewed.  49 year old male, resting comfortably and in no acute distress. Vital signs are significant for hypertension. Oxygen saturation is 99%, which is normal. Head is normocephalic and atraumatic. PERRLA, EOMI. Oropharynx is clear. Neck is nontender and supple without adenopathy or JVD. Back is nontender and there is no CVA tenderness. Lungs are clear without rales, wheezes, or rhonchi. Chest is mildly tender in the right parasternal area. Heart has regular rate and rhythm without murmur. Abdomen is soft, flat, nontender without masses or hepatosplenomegaly and peristalsis is normoactive. Extremities have no  cyanosis or edema, full range of motion is present. Skin is warm and dry without rash. Neurologic: Mental status is normal, cranial nerves are intact, there are no motor or sensory deficits.  ED Course  Procedures (including critical care time) Labs Review Results for orders placed or performed during the hospital encounter of 12/18/14  CBC  Result Value Ref Range   WBC 5.7 4.0 - 10.5 K/uL   RBC 5.42 4.22 - 5.81 MIL/uL   Hemoglobin 14.5 13.0 - 17.0 g/dL   HCT 16.145.4 09.639.0 - 04.552.0 %   MCV  83.8 78.0 - 100.0 fL   MCH 26.8 26.0 - 34.0 pg   MCHC 31.9 30.0 - 36.0 g/dL   RDW 40.913.9 81.111.5 - 91.415.5 %   Platelets 193 150 - 400 K/uL  Basic metabolic panel  Result Value Ref Range   Sodium 141 135 - 145 mmol/L   Potassium 3.7 3.5 - 5.1 mmol/L   Chloride 106 101 - 111 mmol/L   CO2 27 22 - 32 mmol/L   Glucose, Bld 143 (H) 70 - 99 mg/dL   BUN 16 6 - 20 mg/dL   Creatinine, Ser 7.821.33 (H) 0.61 - 1.24 mg/dL   Calcium 8.9 8.9 - 95.610.3 mg/dL   GFR calc non Af Amer >60 >60 mL/min   GFR calc Af Amer >60 >60 mL/min   Anion gap 8 5 - 15  Troponin I  Result Value Ref Range   Troponin I <0.03 <0.031 ng/mL  Differential  Result Value Ref Range   Neutrophils Relative % 49 43 - 77 %   Neutro Abs 2.7 1.7 - 7.7 K/uL   Lymphocytes Relative 37 12 - 46 %   Lymphs Abs 2.1 0.7 - 4.0 K/uL   Monocytes Relative 12 3 - 12 %   Monocytes Absolute 0.7 0.1 - 1.0 K/uL   Eosinophils Relative 2 0 - 5 %   Eosinophils Absolute 0.1 0.0 - 0.7 K/uL   Basophils Relative 0 0 - 1 %   Basophils Absolute 0.0 0.0 - 0.1 K/uL   Imaging Review Dg Chest Portable 1 View  12/18/2014   CLINICAL DATA:  Chest pain and dizziness, pain radiating to LEFT arm for 1.5 hours. History of hypertension.  EXAM: PORTABLE CHEST - 1 VIEW  COMPARISON:  None.  FINDINGS: Cardiomediastinal silhouette is unremarkable for this low inspiratory portable examination with crowded vasculature markings. The lungs are clear without pleural effusions or focal consolidations. Trachea projects midline and there is no pneumothorax. Included soft tissue planes and osseous structures are non-suspicious.  IMPRESSION: Normal portable chest radiograph.   Electronically Signed   By: Awilda Metroourtnay  Bloomer   On: 12/18/2014 02:54    ECG shows normal sinus rhythm with a rate of 62, no ectopy. Normal axis. Normal P wave. Normal QRS. Normal intervals. Normal ST and T waves. Impression: normal ECG. When compared with ECG of 01/05/2008, no significant changes are seen.  MDM    Final diagnoses:  Chest pain, unspecified chest pain type    Chest pain of uncertain cause. Patient has significant cardiac risk factors and did have partial response to nitroglycerin. Anticipate he will need to be admitted for serial troponins.  In the ED, he also had partial response to nitroglycerin. Initial troponin is negative He will be given morphine for pain and arrangements will be made for hospital admission. Case is discussed with Dr. Conley RollsLe of [facility] who agrees to come and evaluate the patient for possible admission.  Dione Boozeavid Alicya Bena, MD  12/18/14 0724 

## 2014-12-18 NOTE — ED Notes (Signed)
Pt brought in by [facility] for c/o chest pain after having a bm; pt was administered 324mg  of aspirin and 0.4mg  SL nitro; pt's pain was 8 and decreased to a 6 after the nitro

## 2014-12-19 ENCOUNTER — Observation Stay (HOSPITAL_COMMUNITY)

## 2014-12-19 ENCOUNTER — Encounter (HOSPITAL_COMMUNITY): Payer: Self-pay | Admitting: Internal Medicine

## 2014-12-19 DIAGNOSIS — I10 Essential (primary) hypertension: Secondary | ICD-10-CM | POA: Diagnosis not present

## 2014-12-19 DIAGNOSIS — N179 Acute kidney failure, unspecified: Secondary | ICD-10-CM

## 2014-12-19 DIAGNOSIS — R946 Abnormal results of thyroid function studies: Secondary | ICD-10-CM

## 2014-12-19 DIAGNOSIS — R0789 Other chest pain: Secondary | ICD-10-CM | POA: Diagnosis not present

## 2014-12-19 HISTORY — DX: Abnormal results of thyroid function studies: R94.6

## 2014-12-19 LAB — CBC
HCT: 45.5 % (ref 39.0–52.0)
HEMOGLOBIN: 14.9 g/dL (ref 13.0–17.0)
MCH: 27.2 pg (ref 26.0–34.0)
MCHC: 32.7 g/dL (ref 30.0–36.0)
MCV: 83.2 fL (ref 78.0–100.0)
PLATELETS: 209 10*3/uL (ref 150–400)
RBC: 5.47 MIL/uL (ref 4.22–5.81)
RDW: 13.8 % (ref 11.5–15.5)
WBC: 6.7 10*3/uL (ref 4.0–10.5)

## 2014-12-19 LAB — COMPREHENSIVE METABOLIC PANEL
ALBUMIN: 3.8 g/dL (ref 3.5–5.0)
ALT: 18 U/L (ref 17–63)
AST: 16 U/L (ref 15–41)
Alkaline Phosphatase: 57 U/L (ref 38–126)
Anion gap: 6 (ref 5–15)
BUN: 15 mg/dL (ref 6–20)
CALCIUM: 8.9 mg/dL (ref 8.9–10.3)
CO2: 28 mmol/L (ref 22–32)
Chloride: 106 mmol/L (ref 101–111)
Creatinine, Ser: 1.19 mg/dL (ref 0.61–1.24)
GFR calc Af Amer: >60 mL/min (ref >60)
GLUCOSE: 105 mg/dL — AB (ref 70–99)
POTASSIUM: 3.7 mmol/L (ref 3.5–5.1)
Sodium: 140 mmol/L (ref 135–145)
Total Bilirubin: 1.1 mg/dL (ref 0.3–1.2)
Total Protein: 6.9 g/dL (ref 6.5–8.1)

## 2014-12-19 LAB — LIPID PANEL
Cholesterol: 282 mg/dL — ABNORMAL HIGH (ref 0–200)
HDL: 30 mg/dL — ABNORMAL LOW (ref >40)
LDL CALC: 211 mg/dL — AB (ref 0–99)
TRIGLYCERIDES: 204 mg/dL — AB (ref <150)
Total CHOL/HDL Ratio: 9.4 RATIO
VLDL: 41 mg/dL — AB (ref 0–40)

## 2014-12-19 MED ORDER — OMEPRAZOLE 20 MG PO CPDR: 20.0000 mg | DELAYED_RELEASE_CAPSULE | Freq: Every day | ORAL | Status: AC

## 2014-12-19 MED ORDER — EZETIMIBE-SIMVASTATIN 10-40 MG PO TABS: 1.0000 | ORAL_TABLET | Freq: Every day | ORAL | Status: AC

## 2014-12-19 MED ORDER — HYDROCHLOROTHIAZIDE 12.5 MG PO CAPS
12.5000 mg | ORAL_CAPSULE | Freq: Every day | ORAL | Status: DC
  Administered 2014-12-19: 12.5 mg via ORAL
  Filled 2014-12-19: qty 1

## 2014-12-19 MED ORDER — METOPROLOL SUCCINATE ER 100 MG PO TB24: ORAL_TABLET | ORAL | Status: AC

## 2014-12-19 MED ORDER — METOPROLOL SUCCINATE ER 50 MG PO TB24: ORAL_TABLET | ORAL | Status: AC

## 2014-12-19 MED ORDER — AMLODIPINE BESYLATE 2.5 MG PO TABS: 2.5000 mg | ORAL_TABLET | Freq: Every day | ORAL | Status: AC

## 2014-12-19 MED ORDER — HYDROCHLOROTHIAZIDE 25 MG PO TABS: 12.5000 mg | ORAL_TABLET | Freq: Every day | ORAL | Status: AC

## 2014-12-19 NOTE — Care Management Note (Signed)
Case Management Note  Patient Details  Name: Todd Robles MRN: [id number] Date of Birth: [birth year]  Subjective/Objective:                  [facility] Pt will return to facility at discharge.  Action/Plan: Pt for discharge today. CM spoke with Sunnie Nielsenindy Walhall RN at the facility and she stated pt was ok to return to the facility. No other Cm needs noted.  Expected Discharge Date:  12/19/14               Expected Discharge Plan:  [facility]  In-House Referral:  NA  Discharge planning Services  CM Consult  Post Acute Care Choice:  NA Choice offered to:  NA  DME Arranged:    DME Agency:     HH Arranged:    HH Agency:     Status of Service:     Medicare Important Message Given:    Date Medicare IM Given:    Medicare IM give by:    Date Additional Medicare IM Given:    Additional Medicare Important Message give by:     If discussed at Long Length of Stay Meetings, dates discussed:    Additional Comments:  Cheryl FlashBlackwell, Wilhelmina Hark Crowder, RN 12/19/2014, 1:18 PM

## 2014-12-19 NOTE — Discharge Summary (Signed)
Physician Discharge Summary  Todd NewcomerDouglas M XXXBowe ZOX:[id number] DOB: [birth year] DOA: 12/18/2014  PCP: No PCP Per Patient  Admit date: 12/18/2014 Discharge date: 12/19/2014  Time spent: Greater than 30 minutes  Recommendations for Outpatient Follow-up:  1. Short acting metoprolol was changed to long-acting Toprol-XL because of bradycardia. 2. Hydrochlorothiazide was decreased to 12.5 mg daily because of acute renal failure from prerenal azotemia. 3. Amlodipine was started at 2.5 mg daily which can be titrated up. 4. Simvastatin was discontinued in favor of Vytorin because the patient's LDL cholesterol was 211.  5. Recommend follow-up with the patient's TSH in 3-6 months along with free T4.   Discharge Diagnoses:  1. Atypical chest pain. Myocardial infarction ruled out. 2. Mild LVH per 2-D echocardiogram. Ejection fraction was 60-65%. 3. Essential hypertension. 4. Hyperlipidemia.  5. Acute kidney injury, likely prerenal azotemia from hydrochlorothiazide. 6. Possible GERD. 7. Mildly elevated TSH of 6.1.   Discharge Condition: Improved and stable.  Diet recommendation: Heart healthy.  Filed Weights   12/18/14 0229 12/18/14 2331  Weight: 88.451 kg (195 lb) 89.9 kg (198 lb 3.1 oz)    History of present illness:  The patient is a 49 year old man in the custody of the [facility], history of hypertension, hyperlipidemia, and GERD, who presented to the emergency department on 12/18/2014 with a complaint of chest pain.  Hospital Course:  The patient was restarted on his chronic medications including aspirin, simvastatin, and metoprolol. However, hydrochlorothiazide was withheld because of his elevated creatinine of 1.33. He was started on gentle IV fluids. Morphine was written as needed for pain. Sublingual nitroglycerin was also ordered if needed for pain. Protonix was started empirically. For further evaluation, a number studies were ordered. His troponin I was negative 3, so he  ruled out for myocardial infarction. His d-dimer was within normal limits. His 2-D echocardiogram revealed mild LVH; ejection fraction of 60-65%; and no wall motion abnormalities. His fasting lipid panel revealed a total cholesterol of 282, triglycerides of 204, HDL of 30, and LDL of 211. His TSH was mildly elevated at 6.1 (range 0.350-4.50).  Patient was noted to be bradycardic with his heart rate consistently in the 50s, but occasionally fell to the upper 40s. For this reason, short acting metoprolol was discontinued in favor of Toprol-XL, which was prescribed at the time of discharge. His renal function improved, but it was felt that his acute renal injury was secondary to prerenal azotemia from HCTZ. For this reason, HCTZ dosing was decreased to 12.5 mg daily. Because of the changes in metoprolol and hydrochlorothiazide, Norvasc was added. Also, simvastatin was discontinued in favor of Vytorin which would hopefully improve his hyperlipidemia. The patient's TSH was slightly elevated, but this was not thought to be clinical hypothyroidism. Would recommend that his TSH and free T4 be monitored in the next 3-6 months.  His chest pain completely resolved. His creatinine improved to 1.19. He remained hemodynamically stable, though bradycardic.  Procedures:  2-D echocardiogram:Study Conclusions - Left ventricle: The cavity size was normal. Wall thickness was increased in a pattern of mild LVH. Systolic function was normal. The estimated ejection fraction was in the range of 60% to 65%. Wall motion was normal; there were no regional wall motion abnormalities. Left ventricular diastolic function parameters were normal for the patient&'s age. - Mitral valve: There was trivial regurgitation. - Left atrium: The atrium was at the upper limits of normal in size. - Right atrium: Central venous pressure (est): 3 mm Hg. - Atrial septum: No  defect or patent foramen ovale was identified. -  Tricuspid valve: There was trivial regurgitation. - Pulmonary arteries: Systolic pressure could not be accurately estimated. - Pericardium, extracardiac: A trivial pericardial effusion was identified posterior to the heart. Impressions: - Mild LVH with LVEF 60-65%, grossly normal diastolic function. Upper normal left atrial size. Trivial mitral and tricuspid regurgitation. Unable to assess PASP. Trivial posterior pericardial effusion.  Consultations:  None  Discharge Exam: Filed Vitals:   12/19/14 1017  BP:   Pulse: 59  Temp:   Resp:    temperature 98.2. Pulse 55. Respiratory rate 16. Blood pressure 149/84. Oxygen saturation 100%.  General: Alert 49 year old African-American man, in no acute distress. Cardiovascular: S1, S2, with a soft systolic murmur and bradycardia. Respiratory: Clear to auscultation bilaterally. Neck slight extremity: No pedal edema.  Discharge Instructions   Discharge Instructions    Diet - low sodium heart healthy    Complete by:  As directed      Increase activity slowly    Complete by:  As directed           Current Discharge Medication List    START taking these medications   Details  amLODipine (NORVASC) 2.5 MG tablet Take 1 tablet (2.5 mg total) by mouth daily. Qty: 30 tablet, Refills: 3    ezetimibe-simvastatin (VYTORIN) 10-40 MG per tablet Take 1 tablet by mouth daily. Qty: 30 tablet, Refills: 3    !! metoprolol succinate (TOPROL XL) 50 MG 24 hr tablet Take this 50 mg tablet with the 100 tablet to equal 150 mg daily. Take with or immediately following a meal. Qty: 30 tablet, Refills: 3    !! metoprolol succinate (TOPROL-XL) 100 MG 24 hr tablet Take this 100 mg tablet with the 50 mg tablet to equal 150 mg daily. Take with or immediately following a meal. Qty: 30 tablet, Refills: 3    omeprazole (PRILOSEC) 20 MG capsule Take 1 capsule (20 mg total) by mouth daily. Qty: 30 capsule, Refills: 3     !! - Potential duplicate  medications found. Please discuss with provider.    CONTINUE these medications which have CHANGED   Details  hydrochlorothiazide (HYDRODIURIL) 25 MG tablet Take 0.5 tablets (12.5 mg total) by mouth daily.      CONTINUE these medications which have NOT CHANGED   Details  aspirin 81 MG tablet Take 81 mg by mouth daily.      STOP taking these medications     metoprolol (LOPRESSOR) 100 MG tablet      simvastatin (ZOCOR) 40 MG tablet      HYDROcodone-acetaminophen (NORCO/VICODIN) 5-325 MG per tablet      ibuprofen (ADVIL,MOTRIN) 600 MG tablet      tamsulosin (FLOMAX) 0.4 MG CAPS capsule        No Known Allergies    The results of significant diagnostics from this hospitalization (including imaging, microbiology, ancillary and laboratory) are listed below for reference.    Significant Diagnostic Studies: Dg Chest Portable 1 View  12/18/2014   CLINICAL DATA:  Chest pain and dizziness, pain radiating to LEFT arm for 1.5 hours. History of hypertension.  EXAM: PORTABLE CHEST - 1 VIEW  COMPARISON:  None.  FINDINGS: Cardiomediastinal silhouette is unremarkable for this low inspiratory portable examination with crowded vasculature markings. The lungs are clear without pleural effusions or focal consolidations. Trachea projects midline and there is no pneumothorax. Included soft tissue planes and osseous structures are non-suspicious.  IMPRESSION: Normal portable chest radiograph.   Electronically Signed  By: Awilda Metro   On: 12/18/2014 02:54    Microbiology: Recent Results (from the past 240 hour(s))  MRSA PCR Screening     Status: None   Collection Time: 12/18/14 10:00 AM  Result Value Ref Range Status   MRSA by PCR NEGATIVE NEGATIVE Final    Comment:        The GeneXpert MRSA Assay (FDA approved for NASAL specimens only), is one component of a comprehensive MRSA colonization surveillance program. It is not intended to diagnose MRSA infection nor to guide or monitor  treatment for MRSA infections.      Labs: Basic Metabolic Panel:  Recent Labs Lab 12/18/14 0234 12/18/14 0930 12/19/14 0652  NA 141  --  140  K 3.7  --  3.7  CL 106  --  106  CO2 27  --  28  GLUCOSE 143*  --  105*  BUN 16  --  15  CREATININE 1.33* 1.28* 1.19  CALCIUM 8.9  --  8.9   Liver Function Tests:  Recent Labs Lab 12/19/14 0652  AST 16  ALT 18  ALKPHOS 57  BILITOT 1.1  PROT 6.9  ALBUMIN 3.8   No results for input(s): LIPASE, AMYLASE in the last 168 hours. No results for input(s): AMMONIA in the last 168 hours. CBC:  Recent Labs Lab 12/18/14 0234 12/18/14 0930 12/18/14 1549 12/19/14 0652  WBC 5.7 5.9  --  6.7  NEUTROABS 2.7  --   --   --   HGB 14.5 14.3 13.8 14.9  HCT 45.4 44.5 42.0 45.5  MCV 83.8 84.0  --  83.2  PLT 193 199  --  209   Cardiac Enzymes:  Recent Labs Lab 12/18/14 0234 12/18/14 0930 12/18/14 1430 12/18/14 2004  TROPONINI <0.03 <0.03 <0.03 <0.03   BNP: BNP (last 3 results) No results for input(s): BNP in the last 8760 hours.  ProBNP (last 3 results) No results for input(s): PROBNP in the last 8760 hours.  CBG: No results for input(s): GLUCAP in the last 168 hours.     Signed:  Everleigh Colclasure  [facility] 12/19/2014, 1:08 PM

## 2014-12-19 NOTE — Progress Notes (Signed)
[facility] Rob[facility] with gaurds per MD order.  Discharge instructions reviewed and discussed with the patient and guards at bedside, all questions and concerns answered. Copy of instructions and scripts given to guards to take back to correctional facility at this time.    Medication List    STOP taking these medications        HYDROcodone-acetaminophen 5-325 MG per tablet  Commonly known as:  NORCO/VICODIN     ibuprofen 600 MG tablet  Commonly known as:  ADVIL,MOTRIN     metoprolol 100 MG tablet  Commonly known as:  LOPRESSOR     simvastatin 40 MG tablet  Commonly known as:  ZOCOR     tamsulosin 0.4 MG Caps capsule  Commonly known as:  FLOMAX      TAKE these medications        amLODipine 2.5 MG tablet  Commonly known as:  NORVASC  Take 1 tablet (2.5 mg total) by mouth daily.     aspirin 81 MG tablet  Take 81 mg by mouth daily.     ezetimibe-simvastatin 10-40 MG per tablet  Commonly known as:  VYTORIN  Take 1 tablet by mouth daily.     hydrochlorothiazide 25 MG tablet  Commonly known as:  HYDRODIURIL  Take 0.5 tablets (12.5 mg total) by mouth daily.     metoprolol succinate 100 MG 24 hr tablet  Commonly known as:  TOPROL-XL  Take this 100 mg tablet with the 50 mg tablet to equal 150 mg daily. Take with or immediately following a meal.     metoprolol succinate 50 MG 24 hr tablet  Commonly known as:  TOPROL XL  Take this 50 mg tablet with the 100 tablet to equal 150 mg daily. Take with or immediately following a meal.     omeprazole 20 MG capsule  Commonly known as:  PRILOSEC  Take 1 capsule (20 mg total) by mouth daily.        Patients skin is clean, dry and intact, no evidence of skin break down. IV site discontinued and catheter remains intact. Site without signs and symptoms of complications. Dressing and pressure applied.  Patient escorted to car by Bernette RedbirdKenny, patient advocate in a wheelchair,  no distress noted upon  discharge.  Todd GlassingJames, Todd Robles 12/19/2014 2:51 PM

## 2015-01-15 ENCOUNTER — Emergency Department (HOSPITAL_COMMUNITY)

## 2015-01-15 ENCOUNTER — Encounter (HOSPITAL_COMMUNITY): Payer: Self-pay | Admitting: *Deleted

## 2015-01-15 ENCOUNTER — Emergency Department (HOSPITAL_COMMUNITY)
Admission: EM | Admit: 2015-01-15 | Discharge: 2015-01-15 | Disposition: A | Discharge status: Home / Self Care | Attending: Emergency Medicine | Admitting: Emergency Medicine

## 2015-01-15 DIAGNOSIS — Z87891 Personal history of nicotine dependence: Secondary | ICD-10-CM | POA: Insufficient documentation

## 2015-01-15 DIAGNOSIS — Z7982 Long term (current) use of aspirin: Secondary | ICD-10-CM | POA: Diagnosis not present

## 2015-01-15 DIAGNOSIS — S93401A Sprain of unspecified ligament of right ankle, initial encounter: Secondary | ICD-10-CM

## 2015-01-15 DIAGNOSIS — X58XXXA Exposure to other specified factors, initial encounter: Secondary | ICD-10-CM | POA: Diagnosis not present

## 2015-01-15 DIAGNOSIS — Z79899 Other long term (current) drug therapy: Secondary | ICD-10-CM | POA: Diagnosis not present

## 2015-01-15 DIAGNOSIS — Y92310 Basketball court as the place of occurrence of the external cause: Secondary | ICD-10-CM | POA: Diagnosis not present

## 2015-01-15 DIAGNOSIS — S99911A Unspecified injury of right ankle, initial encounter: Secondary | ICD-10-CM | POA: Diagnosis present

## 2015-01-15 DIAGNOSIS — Y998 Other external cause status: Secondary | ICD-10-CM | POA: Insufficient documentation

## 2015-01-15 DIAGNOSIS — I10 Essential (primary) hypertension: Secondary | ICD-10-CM | POA: Diagnosis not present

## 2015-01-15 DIAGNOSIS — M25579 Pain in unspecified ankle and joints of unspecified foot: Secondary | ICD-10-CM

## 2015-01-15 DIAGNOSIS — Y9367 Activity, basketball: Secondary | ICD-10-CM | POA: Insufficient documentation

## 2015-01-15 DIAGNOSIS — E780 Pure hypercholesterolemia: Secondary | ICD-10-CM | POA: Insufficient documentation

## 2015-01-15 MED ORDER — IBUPROFEN 800 MG PO TABS
800.0000 mg | ORAL_TABLET | Freq: Once | ORAL | Status: AC
  Administered 2015-01-15: 800 mg via ORAL
  Filled 2015-01-15: qty 1

## 2015-01-15 MED ORDER — IBUPROFEN 800 MG PO TABS: 800.0000 mg | ORAL_TABLET | Freq: Three times a day (TID) | ORAL | Status: AC

## 2015-01-15 NOTE — ED Provider Notes (Signed)
CSN: [id number]     Arrival date & time 01/15/15  2007 History   First MD Initiated Contact with Patient 01/15/15 2016     Chief Complaint  Patient presents with  . Ankle Pain     (Consider location/radiation/quality/duration/timing/severity/associated sxs/prior Treatment) Patient is a 49 y.o. male presenting with ankle pain. The history is provided by the patient.  Ankle Pain Location:  Ankle Injury: yes   Ankle location:  R ankle Pain details:    Quality:  Shooting and burning   Radiates to:  Does not radiate   Severity:  Moderate   Onset quality:  Sudden   Timing:  Constant   Progression:  Worsening Chronicity:  New Dislocation: no   Foreign body present:  No foreign bodies Prior injury to area:  No Relieved by:  None tried Worsened by:  Bearing weight Associated symptoms: swelling    Todd Robles is a 49 y.o. male who presents to the ED with law enforcement for an ankle injury. Patient reports while playing basketball today he jumped up and came down turned his foot inward and  injured his right ankle.  Past Medical History  Diagnosis Date  . Hypertension   . Hypercholesteremia   . Abnormal thyroid function test 12/19/2014  . Atypical chest pain 12/18/2014   Past Surgical History  Procedure Laterality Date  . Hernia repair    . Knee surgery      left   History reviewed. No pertinent family history. History  Substance Use Topics  . Smoking status: Former Smoker    Types: Cigarettes  . Smokeless tobacco: Not on file  . Alcohol Use: No    Review of Systems Negative except as stated in HPI  Allergies  Review of patient's allergies indicates no known allergies.  Home Medications   Prior to Admission medications   Medication Sig Start Date End Date Taking? Authorizing Provider  amLODipine (NORVASC) 2.5 MG tablet Take 1 tablet (2.5 mg total) by mouth daily. 12/19/14   Elliot Cousin, MD  aspirin 81 MG tablet Take 81 mg by mouth daily.    Historical Provider,  MD  ezetimibe-simvastatin (VYTORIN) 10-40 MG per tablet Take 1 tablet by mouth daily. 12/19/14   Elliot Cousin, MD  hydrochlorothiazide (HYDRODIURIL) 25 MG tablet Take 0.5 tablets (12.5 mg total) by mouth daily. 12/19/14   Elliot Cousin, MD  ibuprofen (ADVIL,MOTRIN) 800 MG tablet Take 1 tablet (800 mg total) by mouth 3 (three) times daily. 01/15/15   Hope Orlene Och, NP  metoprolol succinate (TOPROL XL) 50 MG 24 hr tablet Take this 50 mg tablet with the 100 tablet to equal 150 mg daily. Take with or immediately following a meal. 12/19/14   Elliot Cousin, MD  metoprolol succinate (TOPROL-XL) 100 MG 24 hr tablet Take this 100 mg tablet with the 50 mg tablet to equal 150 mg daily. Take with or immediately following a meal. 12/19/14   Elliot Cousin, MD  omeprazole (PRILOSEC) 20 MG capsule Take 1 capsule (20 mg total) by mouth daily. 12/19/14   Elliot Cousin, MD   BP 157/85 mmHg  Pulse 80  Temp(Src) 98.9 F (37.2 C) (Oral)  Resp 18  Ht  (1.676 m)  Wt 192 lb (87.091 kg)  BMI 31.00 kg/m2  SpO2 100% Physical Exam  Constitutional: He is oriented to person, place, and time. He appears well-developed and well-nourished. No distress.  HENT:  Head: Normocephalic.  Eyes: EOM are normal.  Neck: Neck supple.  Cardiovascular: Normal rate.  Pulmonary/Chest: Effort normal.  Musculoskeletal:       Right ankle: He exhibits swelling. He exhibits no deformity, no laceration and normal pulse. Decreased range of motion: due to pain. Tenderness. Lateral malleolus tenderness found. Achilles tendon normal.  Pedal pulse strong, adequate circulation, good touch sensation. Good strength with plantar and dorsiflexion.    Neurological: He is alert and oriented to person, place, and time. No cranial nerve deficit.  Skin: Skin is warm and dry.  Psychiatric: He has a normal mood and affect. His behavior is normal.  Nursing note and vitals reviewed.   ED Course  Procedures (including critical care time) Labs Review Labs  Reviewed - No data to display  Imaging Review Dg Ankle Complete Right  01/15/2015   CLINICAL DATA:  Right ankle pain/ injury, fall playing basketball  EXAM: RIGHT ANKLE - COMPLETE 3+ VIEW  COMPARISON:  None.  FINDINGS: No fracture or dislocation is seen.  The ankle mortise is intact.  The base of the fifth metatarsal is unremarkable.  Visualized soft tissues are within normal limits.  IMPRESSION: No fracture or dislocation is seen.   Electronically Signed   By: Charline BillsSriyesh  Krishnan M.D.   On: 01/15/2015 21:04   ASO, ice, NSAIDS, follow up as needed  MDM  49 y.o. male with right ankle pain s/p injury while playing basketball. Stable for d/c without neurovascular compromise. Discussed with the patient clinical and x-ray findings and plan of care. All questioned fully answered. He will return if any problems arise.   Final diagnoses:  Ankle sprain, right, initial encounter       [facility] Neese, NP 01/15/15 2124  Bethann BerkshireJoseph Zammit, MD 01/18/15 1128

## 2015-01-15 NOTE — ED Notes (Signed)
Pain lt ankle , injury playing basketball.  Pt is a prisoner. In cuffs

## 2015-01-15 NOTE — ED Notes (Signed)
Gave report and discharge instructions to Nurse Adele Remi. [facility] accompanied by officers.

## 2015-05-20 ENCOUNTER — Emergency Department (HOSPITAL_COMMUNITY)

## 2015-05-20 ENCOUNTER — Emergency Department (HOSPITAL_COMMUNITY)
Admission: EM | Admit: 2015-05-20 | Discharge: 2015-05-20 | Disposition: A | Discharge status: Home / Self Care | Attending: Emergency Medicine | Admitting: Emergency Medicine

## 2015-05-20 ENCOUNTER — Encounter (HOSPITAL_COMMUNITY): Payer: Self-pay | Admitting: Emergency Medicine

## 2015-05-20 DIAGNOSIS — Z79899 Other long term (current) drug therapy: Secondary | ICD-10-CM | POA: Insufficient documentation

## 2015-05-20 DIAGNOSIS — I10 Essential (primary) hypertension: Secondary | ICD-10-CM | POA: Diagnosis not present

## 2015-05-20 DIAGNOSIS — E7800 Pure hypercholesterolemia, unspecified: Secondary | ICD-10-CM | POA: Insufficient documentation

## 2015-05-20 DIAGNOSIS — Z7982 Long term (current) use of aspirin: Secondary | ICD-10-CM | POA: Diagnosis not present

## 2015-05-20 DIAGNOSIS — Z87891 Personal history of nicotine dependence: Secondary | ICD-10-CM | POA: Insufficient documentation

## 2015-05-20 DIAGNOSIS — R0789 Other chest pain: Secondary | ICD-10-CM | POA: Insufficient documentation

## 2015-05-20 DIAGNOSIS — R079 Chest pain, unspecified: Secondary | ICD-10-CM | POA: Diagnosis present

## 2015-05-20 LAB — BASIC METABOLIC PANEL
Anion gap: 7 (ref 5–15)
BUN: 16 mg/dL (ref 6–20)
CHLORIDE: 104 mmol/L (ref 101–111)
CO2: 27 mmol/L (ref 22–32)
CREATININE: 1.18 mg/dL (ref 0.61–1.24)
Calcium: 9.3 mg/dL (ref 8.9–10.3)
GFR calc Af Amer: >60 mL/min (ref >60)
GFR calc non Af Amer: >60 mL/min (ref >60)
GLUCOSE: 115 mg/dL — AB (ref 65–99)
POTASSIUM: 4 mmol/L (ref 3.5–5.1)
SODIUM: 138 mmol/L (ref 135–145)

## 2015-05-20 LAB — LIPASE, BLOOD: Lipase: 24 U/L (ref 22–51)

## 2015-05-20 LAB — I-STAT TROPONIN, ED: TROPONIN I, POC: 0 ng/mL (ref 0.00–0.08)

## 2015-05-20 LAB — CBC
HEMATOCRIT: 42.3 % (ref 39.0–52.0)
HEMOGLOBIN: 13.8 g/dL (ref 13.0–17.0)
MCH: 27.1 pg (ref 26.0–34.0)
MCHC: 32.6 g/dL (ref 30.0–36.0)
MCV: 82.9 fL (ref 78.0–100.0)
Platelets: 195 10*3/uL (ref 150–400)
RBC: 5.1 MIL/uL (ref 4.22–5.81)
RDW: 13.6 % (ref 11.5–15.5)
WBC: 5.7 10*3/uL (ref 4.0–10.5)

## 2015-05-20 LAB — HEPATIC FUNCTION PANEL
ALBUMIN: 3.9 g/dL (ref 3.5–5.0)
ALK PHOS: 52 U/L (ref 38–126)
ALT: 50 U/L (ref 17–63)
AST: 32 U/L (ref 15–41)
Bilirubin, Direct: <0.1 mg/dL — ABNORMAL LOW (ref 0.1–0.5)
TOTAL PROTEIN: 6.6 g/dL (ref 6.5–8.1)
Total Bilirubin: 0.9 mg/dL (ref 0.3–1.2)

## 2015-05-20 MED ORDER — SODIUM CHLORIDE 0.9 % IV SOLN: 20.0000 mL | INTRAVENOUS | Status: DC

## 2015-05-20 MED ORDER — METOPROLOL SUCCINATE ER 25 MG PO TB24
100.0000 mg | ORAL_TABLET | Freq: Two times a day (BID) | ORAL | Status: AC
  Administered 2015-05-20: 100 mg via ORAL
  Filled 2015-05-20: qty 4

## 2015-05-20 MED ORDER — AMLODIPINE BESYLATE 5 MG PO TABS
2.5000 mg | ORAL_TABLET | Freq: Every day | ORAL | Status: DC
  Administered 2015-05-20: 2.5 mg via ORAL
  Filled 2015-05-20: qty 1

## 2015-05-20 MED ORDER — LISINOPRIL 20 MG PO TABS
20.0000 mg | ORAL_TABLET | Freq: Every day | ORAL | Status: DC
  Administered 2015-05-20: 20 mg via ORAL
  Filled 2015-05-20: qty 1

## 2015-05-20 MED ORDER — ASPIRIN 81 MG PO CHEW
324.0000 mg | CHEWABLE_TABLET | Freq: Once | ORAL | Status: AC
  Administered 2015-05-20: 324 mg via ORAL
  Filled 2015-05-20: qty 4

## 2015-05-20 MED ORDER — FAMOTIDINE 20 MG PO TABS: 20.0000 mg | ORAL_TABLET | Freq: Two times a day (BID) | ORAL | Status: AC

## 2015-05-20 MED ORDER — HYDROCHLOROTHIAZIDE 25 MG PO TABS
25.0000 mg | ORAL_TABLET | Freq: Every day | ORAL | Status: DC
  Administered 2015-05-20: 25 mg via ORAL
  Filled 2015-05-20: qty 1

## 2015-05-20 MED ORDER — PANTOPRAZOLE SODIUM 40 MG IV SOLR
40.0000 mg | INTRAVENOUS | Status: AC
  Administered 2015-05-20: 40 mg via INTRAVENOUS
  Filled 2015-05-20: qty 40

## 2015-05-20 MED ORDER — GI COCKTAIL ~~LOC~~
30.0000 mL | Freq: Once | ORAL | Status: AC
  Administered 2015-05-20: 30 mL via ORAL
  Filled 2015-05-20: qty 30

## 2015-05-20 MED ORDER — ACETAMINOPHEN 500 MG PO TABS
1000.0000 mg | ORAL_TABLET | Freq: Once | ORAL | Status: AC
  Administered 2015-05-20: 1000 mg via ORAL

## 2015-05-20 NOTE — ED Provider Notes (Signed)
CSN: [id number]     Arrival date & time 05/20/15  6213 History   First MD Initiated Contact with Patient 05/20/15 [phone]     Chief Complaint  Patient presents with  . Chest Pain     (Consider location/radiation/quality/duration/timing/severity/associated sxs/prior Treatment) HPI Comments: Patient is a 49 year old male, he has a history of hypertension, he has had multiple episodes of chest pain for which she has been worked up at multiple different institutions including this hospital in May as well as [facility] 2 months ago at which time he had a stress test and heart catheterization. Thankfully his heart catheterization showed no obstructive disease and he was released with noncardiac chest pain. He presents today with nausea, increased epigastric and chest discomfort, started at 3:00 in the morning when he woke up to go to work. This is similar to pain that he has had in the past. It is ongoing, mild and not associated with diaphoresis or SOB.  No coughing  He does report having a syncopal event. 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 He has had this exact same presentation in the past with passing out, nausea and abdominal discomfort. That is what led up to his heart catheterization 2 months ago.  Patient is a 49 y.o. male presenting with chest pain. The history is provided by the patient.  Chest Pain   Past Medical History  Diagnosis Date  . Hypertension   . Hypercholesteremia   . Abnormal thyroid function test 12/19/2014  . Atypical chest pain 12/18/2014   Past Surgical History  Procedure Laterality Date  . Hernia repair    . Knee surgery      left   History reviewed. No pertinent family history. Social History  Substance Use Topics  . Smoking status: Former Smoker    Types: Cigarettes  . Smokeless tobacco: None   . Alcohol Use: No    Review of Systems  Cardiovascular: Positive for chest pain.  All other systems reviewed and are negative.     Allergies  Review of patient's allergies indicates no known allergies.  Home Medications   Prior to Admission medications   Medication Sig Start Date End Date Taking? Authorizing Provider  amLODipine (NORVASC) 2.5 MG tablet Take 1 tablet (2.5 mg total) by mouth daily. 12/19/14  Yes Elliot Cousin, MD  aspirin 81 MG tablet Take 81 mg by mouth daily.   Yes Historical Provider, MD  ezetimibe-simvastatin (VYTORIN) 10-40 MG per tablet Take 1 tablet by mouth daily. 12/19/14  Yes Elliot Cousin, MD  hydrochlorothiazide (HYDRODIURIL) 25 MG tablet Take 0.5 tablets (12.5 mg total) by mouth daily. Patient taking differently: Take 25 mg by mouth daily.  12/19/14  Yes Elliot Cousin, MD  ibuprofen (ADVIL,MOTRIN) 800 MG tablet Take 1 tablet (800 mg total) by mouth 3 (three) times daily. 01/15/15  Yes Hope Orlene Och, NP  lisinopril (PRINIVIL,ZESTRIL) 20 MG tablet Take 20 mg by mouth daily.   Yes Historical Provider, MD  metoprolol succinate (TOPROL XL) 50 MG 24 hr tablet Take this 50 mg tablet with the 100 tablet to equal 150 mg daily. Take with or immediately following a meal. Patient taking differently: Take 50 mg by mouth daily. Take this 50 mg tablet with the 100 tablet to equal 150 mg daily. Take with or  immediately following a meal. 12/19/14  Yes Elliot Cousin, MD  metoprolol succinate (TOPROL-XL) 100 MG 24 hr tablet Take this 100 mg tablet with the 50 mg tablet to equal 150 mg daily. Take with or immediately following a meal. Patient taking differently: Take 100 mg by mouth 2 (two) times daily. Take this 100 mg tablet with the 50 mg tablet to equal 150 mg daily. Take with or immediately following a meal. 12/19/14  Yes Elliot Cousin, MD  simvastatin (ZOCOR) 40 MG tablet Take 40 mg by mouth at bedtime.   Yes Historical Provider, MD  tamsulosin (FLOMAX) 0.4 MG CAPS capsule Take 0.4  mg by mouth daily.   Yes Historical Provider, MD  famotidine (PEPCID) 20 MG tablet Take 1 tablet (20 mg total) by mouth 2 (two) times daily. 05/20/15   Eber Hong, MD  omeprazole (PRILOSEC) 20 MG capsule Take 1 capsule (20 mg total) by mouth daily. 12/19/14   Elliot Cousin, MD   BP 160/96 mmHg  Pulse 61  Temp(Src) 98.2 F (36.8 C) (Oral)  Resp 12  Ht  (1.702 m)  Wt 203 lb (92.08 kg)  BMI 31.79 kg/m2  SpO2 100% Physical Exam  Constitutional: He appears well-developed and well-nourished. No distress.  HENT:  Head: Normocephalic and atraumatic.  Mouth/Throat: Oropharynx is clear and moist. No oropharyngeal exudate.  Eyes: Conjunctivae and EOM are normal. Pupils are equal, round, and reactive to light. Right eye exhibits no discharge. Left eye exhibits no discharge. No scleral icterus.  Neck: Normal range of motion. Neck supple. No JVD present. No thyromegaly present.  Cardiovascular: Normal rate, regular rhythm, normal heart sounds and intact distal pulses.  Exam reveals no gallop and no friction rub.   No murmur heard. Pulmonary/Chest: Effort normal and breath sounds normal. No respiratory distress. He has no wheezes. He has no rales.  Abdominal: Soft. Bowel sounds are normal. He exhibits no distension and no mass. There is tenderness (epigastric discomfort with palpation, no guarding, no Murphy sign, tenderness in the left upper quadrant as well).  Musculoskeletal: Normal range of motion. He exhibits no edema or tenderness.  Lymphadenopathy:    He has no cervical adenopathy.  Neurological: He is alert. Coordination normal.  Skin: Skin is warm and dry. No rash noted. No erythema.  Psychiatric: He has a normal mood and affect. His behavior is normal.  Nursing note and vitals reviewed.   ED Course  Procedures (including critical care time) Labs Review Labs Reviewed  BASIC METABOLIC PANEL - Abnormal; Notable for the following:    Glucose, Bld 115 (*)    All other components  within normal limits  HEPATIC FUNCTION PANEL - Abnormal; Notable for the following:    Bilirubin, Direct <0.1 (*)    All other components within normal limits  CBC  LIPASE, BLOOD  I-STAT TROPOININ, ED    Imaging Review Dg Chest Portable 1 View  05/20/2015   CLINICAL DATA:  Chest pain  EXAM: PORTABLE CHEST 1 VIEW  COMPARISON:  12/18/2014  FINDINGS: The heart size and mediastinal contours are within normal limits. Both lungs are clear. The visualized skeletal structures are unremarkable.  IMPRESSION: No active disease.   Electronically Signed   By: Marlan Palau M.D.   On: 05/20/2015 09:43   I have personally reviewed and evaluated these images and lab results as part of my medical decision-making.   EKG Interpretation   Date/Time:  Sunday May 20 2015 08:59:20 EDT Ventricular Rate:  66 PR Interval:  133  QRS Duration: 88 QT Interval:  394 QTC Calculation: 413 R Axis:   -14 Text Interpretation:  Sinus rhythm Normal ECG since last tracing no  significant change Confirmed by Toyia Jelinek  MD, Tyquavious Gamel ([id number]) on 05/20/2015  9:20:53 AM      MDM   Final diagnoses:  Other chest pain  Essential hypertension    likley non cardiac chest pain - zofran / GI cocktail, ECG normal - CXR and labs ordered to r/o other source s/a pancreatitis / posisble PUD / GERD  Labs unremarkable -   Pt given home BP meds - improved, stable for d/c.  Filed Vitals:   05/20/15 1030 05/20/15 1100 05/20/15 1130 05/20/15 1200  BP: 185/92 164/88 157/79 160/96  Pulse: 68 62 62 61  Temp:      TempSrc:      Resp: Height:      Weight:      SpO2: 100% 100% 98% 100%     Meds given in ED:  Medications  0.9 %  sodium chloride infusion (not administered)  amLODipine (NORVASC) tablet 2.5 mg (2.5 mg Oral Given 05/20/15 1212)  hydrochlorothiazide (HYDRODIURIL) tablet 25 mg (25 mg Oral Given 05/20/15 1212)  lisinopril (PRINIVIL,ZESTRIL) tablet 20 mg (20 mg Oral Given 05/20/15 1212)  aspirin chewable  tablet 324 mg (324 mg Oral Given 05/20/15 0919)  gi cocktail (Maalox,Lidocaine,Donnatal) (30 mLs Oral Given 05/20/15 0941)  acetaminophen (TYLENOL) tablet 1,000 mg (1,000 mg Oral Given 05/20/15 1052)  pantoprazole (PROTONIX) injection 40 mg (40 mg Intravenous Given 05/20/15 1055)  metoprolol succinate (TOPROL-XL) 24 hr tablet 100 mg (100 mg Oral Given 05/20/15 1216)    New Prescriptions   FAMOTIDINE (PEPCID) 20 MG TABLET    Take 1 tablet (20 mg total) by mouth 2 (two) times daily.       Eber Hong, MD 05/20/15 1226

## 2015-05-20 NOTE — ED Notes (Signed)
Pt began having epigastric pain that radiated to left arm/fingers tingling.  Nausea upon waking, denies vomiting.  ntg x 1 sl given in sl which took pain from 10/10 to 8/10.  Did not take meds yet today. Has hx of cp.  Cardiac cath approx one month ago at [facility].

## 2016-09-22 IMAGING — CR DG CHEST 1V PORT
1 series · 1 of 1 positions shown · non-contrast
Comparison: None.

CLINICAL DATA: Chest pain and dizziness, pain radiating to LEFT arm
for 1.5 hours. History of hypertension.

EXAM:
PORTABLE CHEST - 1 VIEW

[ap portable]
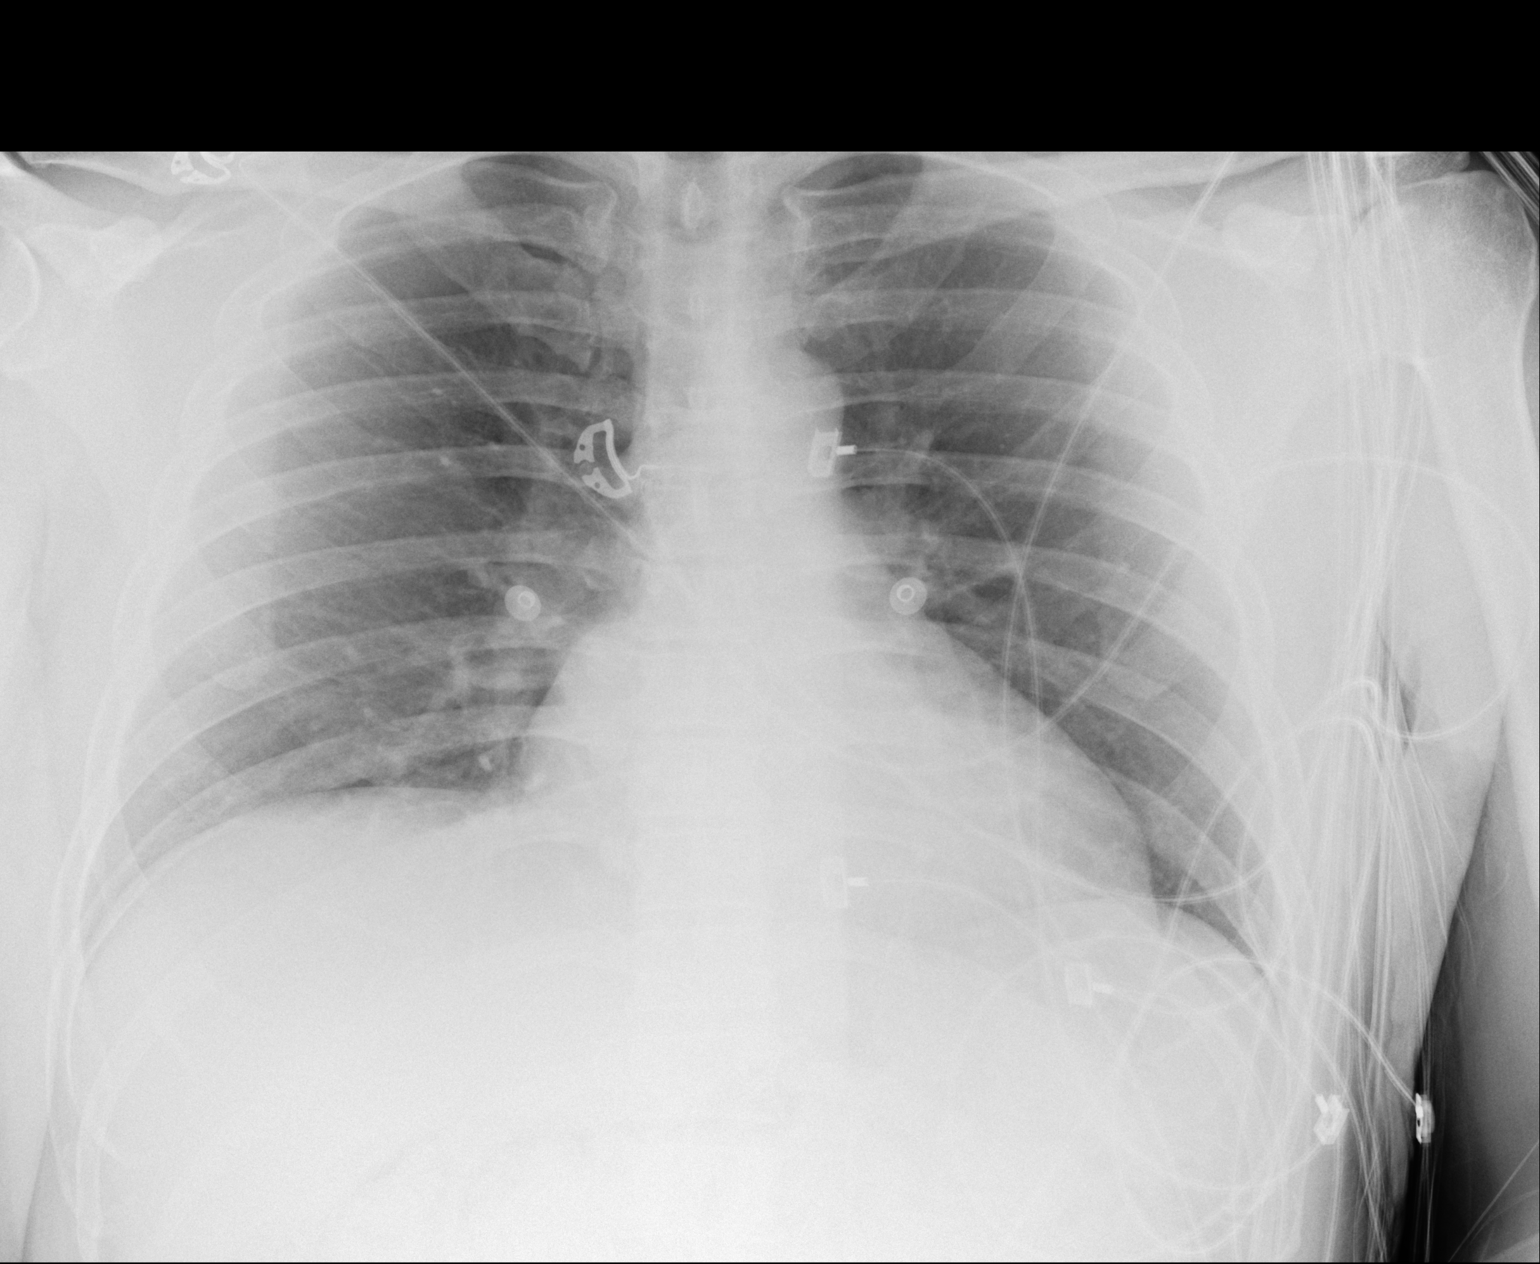

[1 of 1 positions shown; findings below may reference images not displayed]

FINDINGS: Cardiomediastinal silhouette is unremarkable for this low
inspiratory portable examination with crowded vasculature markings.
The lungs are clear without pleural effusions or focal
consolidations. Trachea projects midline and there is no
pneumothorax. Included soft tissue planes and osseous structures are
non-suspicious.
IMPRESSION: Normal portable chest radiograph.

By: Ferienhaus Erxleben

## 2016-10-20 IMAGING — DX DG ANKLE COMPLETE 3+V*R*
3 series · 3 of 3 positions shown · non-contrast
Comparison: None.

CLINICAL DATA: Right ankle pain/ injury, fall playing basketball

EXAM:
RIGHT ANKLE - COMPLETE 3+ VIEW

[ankle ap]
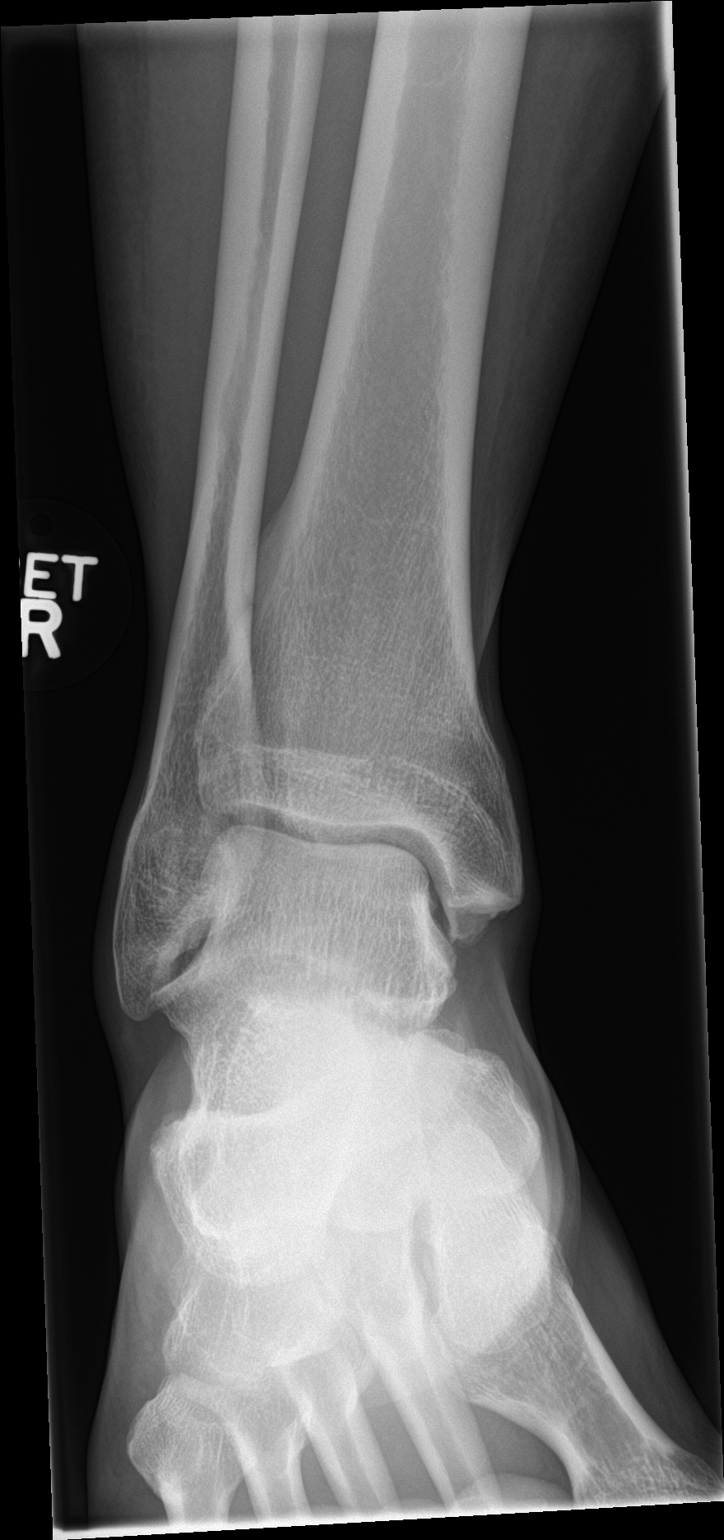

[ankle obl]
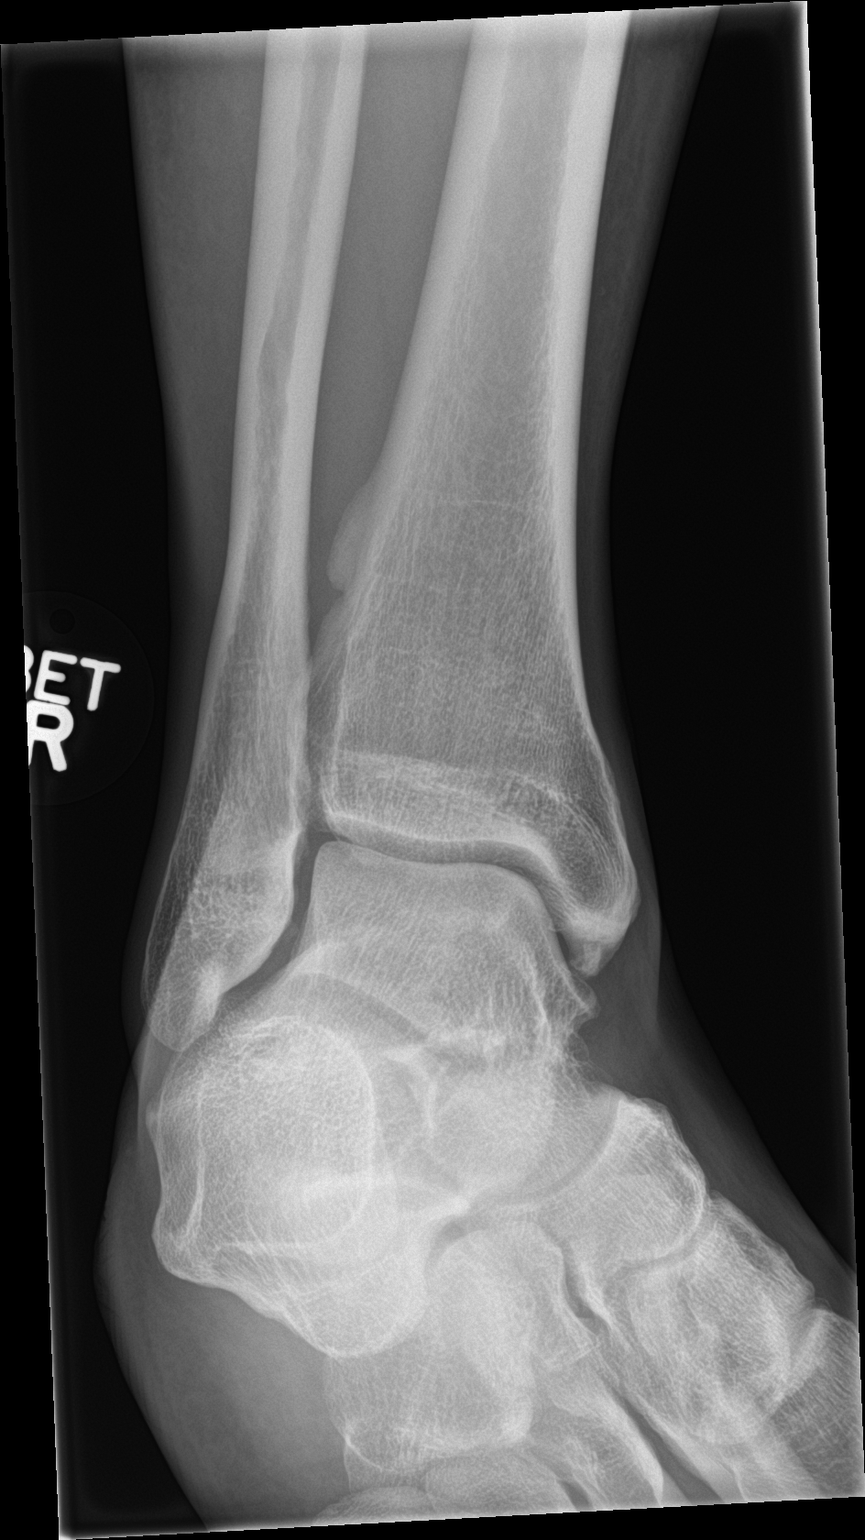

[ankle lat]
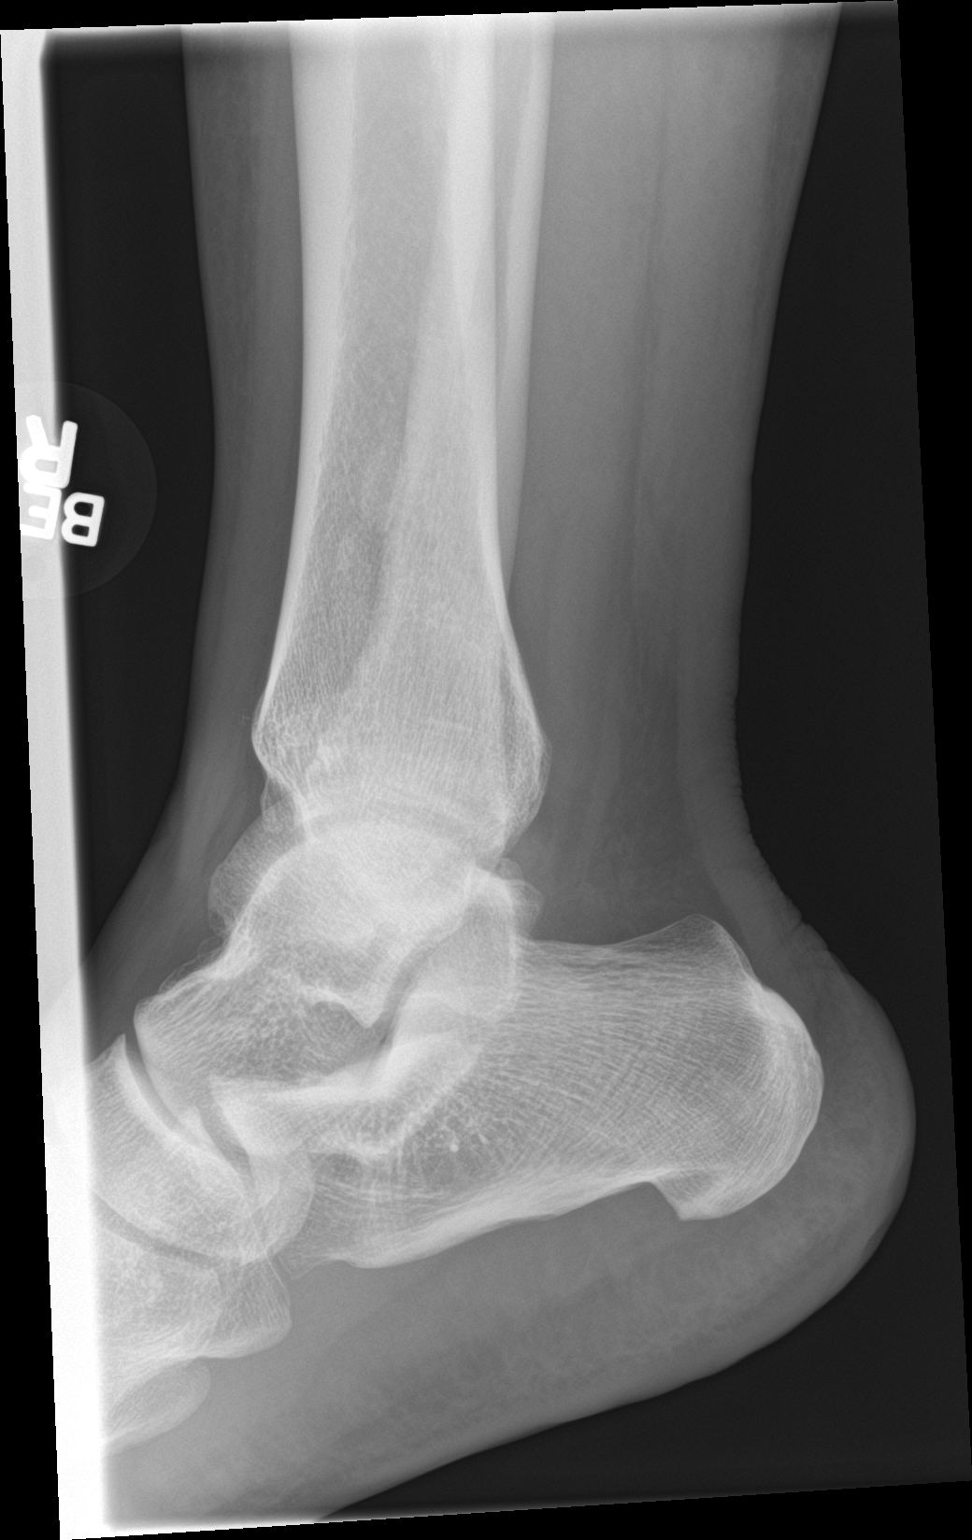

[3 of 3 positions shown; findings below may reference images not displayed]

FINDINGS: No fracture or dislocation is seen.

The ankle mortise is intact.

The base of the fifth metatarsal is unremarkable.

Visualized soft tissues are within normal limits.
IMPRESSION: No fracture or dislocation is seen.

## 2017-02-22 IMAGING — CR DG CHEST 1V PORT
1 series · 1 of 1 positions shown · non-contrast
Comparison: 12/18/2014

CLINICAL DATA: Chest pain

EXAM:
PORTABLE CHEST 1 VIEW

[AP]
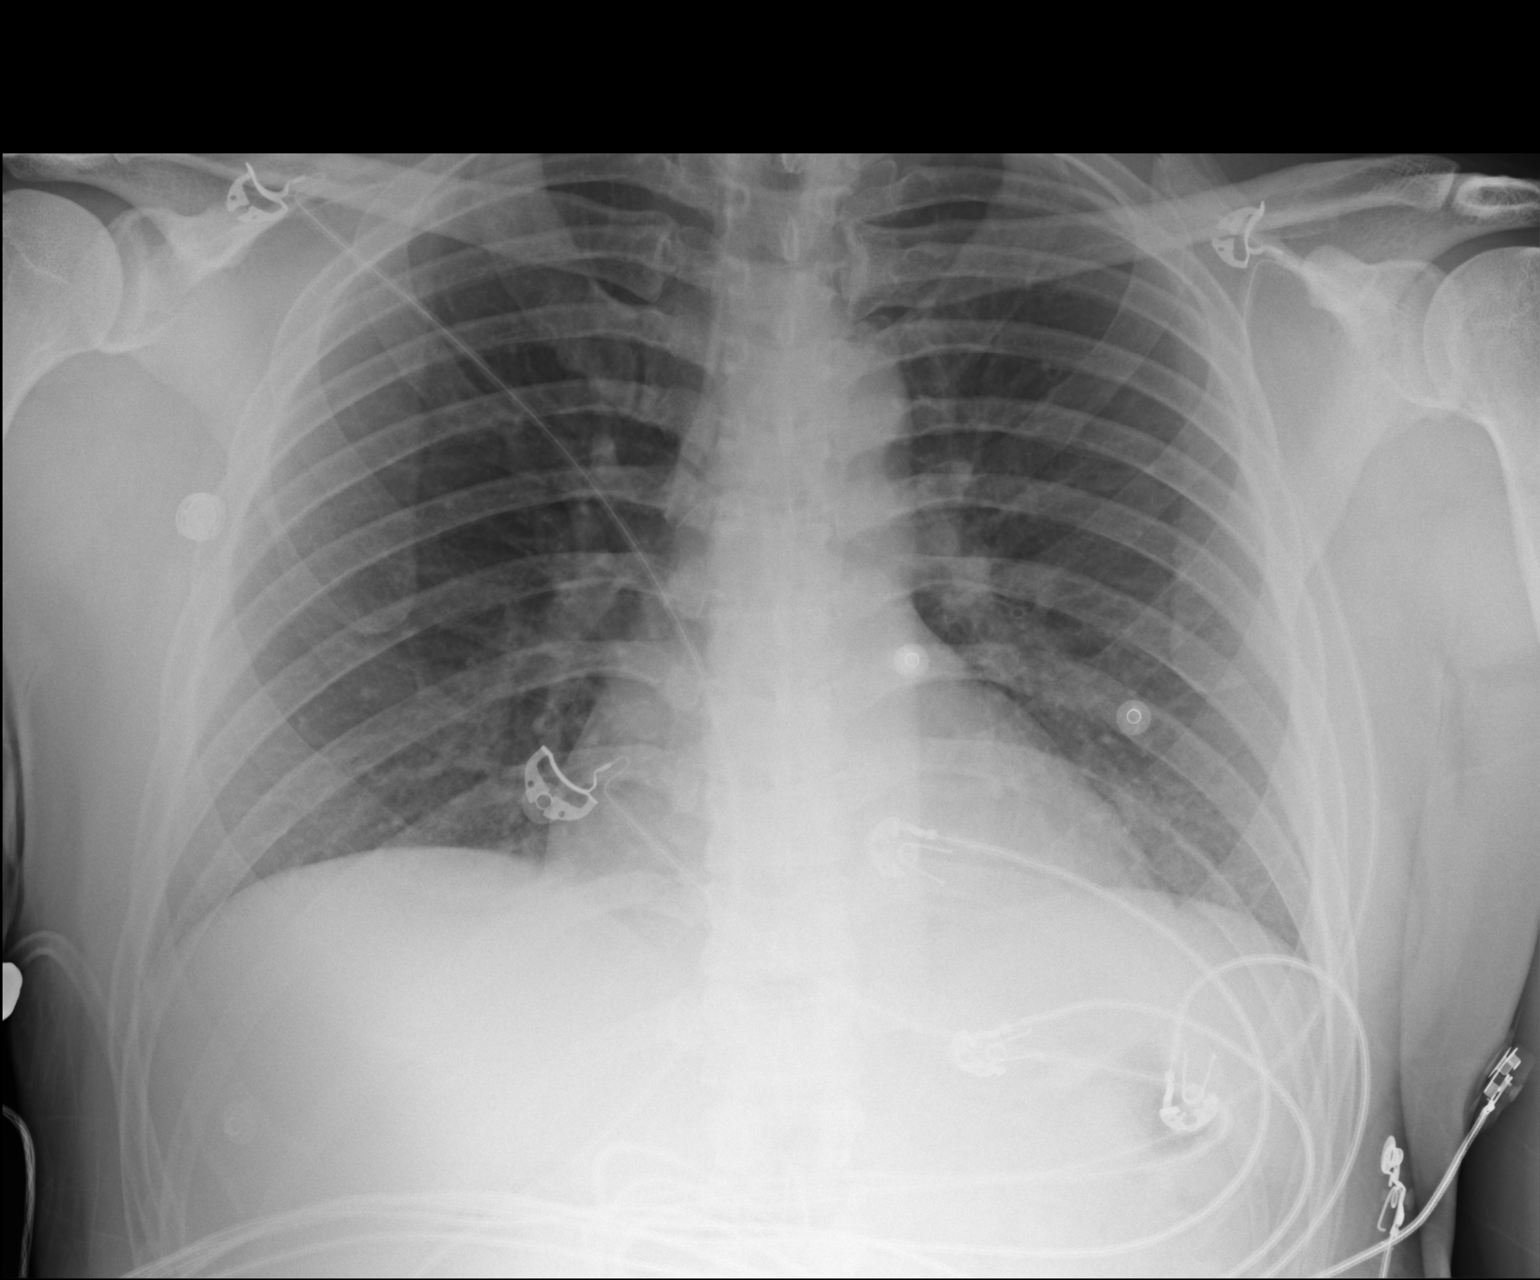

[1 of 1 positions shown; findings below may reference images not displayed]

FINDINGS: The heart size and mediastinal contours are within normal limits.
Both lungs are clear. The visualized skeletal structures are
unremarkable.
IMPRESSION: No active disease.
